# Patient Record
Sex: Female | Born: 1980 | Race: Black or African American | Hispanic: No | Marital: Single | Smoking: Never smoker
Health system: Southern US, Community
[De-identification: ages and names within clinical notes are randomized; demographics above are authoritative.]

## PROBLEM LIST (undated history)

## (undated) DIAGNOSIS — Z6836 Body mass index (BMI) 36.0-36.9, adult: Secondary | ICD-10-CM

## (undated) DIAGNOSIS — I10 Essential (primary) hypertension: Secondary | ICD-10-CM

## (undated) DIAGNOSIS — E785 Hyperlipidemia, unspecified: Secondary | ICD-10-CM

## (undated) DIAGNOSIS — E079 Disorder of thyroid, unspecified: Secondary | ICD-10-CM

## (undated) HISTORY — DX: Disorder of thyroid, unspecified: E07.9

## (undated) HISTORY — DX: Hyperlipidemia, unspecified: E78.5

## (undated) HISTORY — DX: Essential (primary) hypertension: I10

## (undated) HISTORY — DX: Body mass index (bmi) 36.0-36.9, adult: Z68.36

---

## 2009-12-24 HISTORY — PX: MYOMECTOMY: SHX85

## 2013-04-23 HISTORY — PX: UTERINE FIBROID SURGERY: SHX826

## 2013-05-14 ENCOUNTER — Ambulatory Visit: Payer: Self-pay | Admitting: Certified Nurse Midwife

## 2013-06-01 ENCOUNTER — Encounter: Payer: Self-pay | Admitting: Certified Nurse Midwife

## 2013-06-02 ENCOUNTER — Ambulatory Visit: Payer: Self-pay | Admitting: Certified Nurse Midwife

## 2013-06-02 DIAGNOSIS — Z01419 Encounter for gynecological examination (general) (routine) without abnormal findings: Secondary | ICD-10-CM

## 2013-06-03 ENCOUNTER — Encounter: Payer: Self-pay | Admitting: Certified Nurse Midwife

## 2013-06-05 ENCOUNTER — Telehealth: Payer: Self-pay | Admitting: Certified Nurse Midwife

## 2013-06-05 NOTE — Telephone Encounter (Signed)
Hi, Shanda Bumps! Please waive the dnka fee for this patient. Thanks!

## 2013-06-22 ENCOUNTER — Encounter: Payer: Self-pay | Admitting: Obstetrics and Gynecology

## 2013-06-22 ENCOUNTER — Ambulatory Visit (INDEPENDENT_AMBULATORY_CARE_PROVIDER_SITE_OTHER): Payer: Commercial Managed Care - PPO | Admitting: Obstetrics and Gynecology

## 2013-06-22 VITALS — BP 120/80 | HR 72 | Resp 12 | Ht 68.75 in | Wt 230.0 lb

## 2013-06-22 DIAGNOSIS — Z01419 Encounter for gynecological examination (general) (routine) without abnormal findings: Secondary | ICD-10-CM

## 2013-06-22 DIAGNOSIS — Z0000 Encounter for general adult medical examination without abnormal findings: Secondary | ICD-10-CM

## 2013-06-22 LAB — HEMOGLOBIN, FINGERSTICK: Hemoglobin, fingerstick: 11.8 g/dL — ABNORMAL LOW (ref 12.0–16.0)

## 2013-06-22 LAB — POCT URINALYSIS DIPSTICK
Leukocytes, UA: NEGATIVE
Urobilinogen, UA: NEGATIVE

## 2013-06-22 MED ORDER — NORETHINDRONE 0.35 MG PO TABS: 1.0000 | ORAL_TABLET | Freq: Every day | ORAL | Status: DC

## 2013-06-22 NOTE — Progress Notes (Signed)
32 y.o.   Single    African American   female   [id number]   here for annual exam.   Diagnosis of hypertension made last month.  Status post umbilical surgery exploration for bleeding from the umbilicus.  No endometriosis found. - Dr. Trecia Rogers in [place], [place]. Told she has a few fibroids yet.  She belidves the largest is 4 cm.  Status abdominal myomectomy in June 2011 in [place], [place].    Menses monthly.  Last 4 - 5 days.  Takes  Ibuprofen for pain during first two days.  Has an Rx for hydrocodone but does not like to take it.   Had a little bit of bleeding from the incision but has not had her post op visit yet.    Not sexually active since last visit.  Declines STD testing.    Took OCPs in the past.     Patient's last menstrual period was 06/16/2013.          Sexually active: no  The current method of family planning is none.    Exercising: zumba, yoga, walking 3-4x/wk. Last mammogram:  none Last pap smear: 05/12/12 History of abnormal pap: no Smoking: no Alcohol: no Last colonoscopy: none Last Bone Density:  none Last tetanus shot: 05/12/12 Last cholesterol check: 5/14- slightly elevated  Hgb: 11.8  Patient is post op.            Urine: Trace Protein; Trace Blood.  Menses just ended.   Family History  Problem Relation Age of Onset  . Diabetes Mother   . Anxiety disorder Mother   . Hypertension Mother   . Anxiety disorder Father   . Hypertension Father   . Diabetes Maternal Grandmother   . Breast cancer Maternal Grandmother   . Cancer Paternal Grandfather     lung    There are no active problems to display for this patient.   No past medical history on file.  Past Surgical History  Procedure Laterality Date  . Myomectomy  2011    Allergies: Review of patient's allergies indicates no known allergies.  Current Outpatient Prescriptions  Medication Sig Dispense Refill  . ASPIRIN PO Take by mouth as needed.      Marland Kitchen BIOTIN PO Take by mouth. occ      . IBUPROFEN  PO Take by mouth as needed.      . Multiple Vitamins-Minerals (MULTIVITAMIN PO) Take by mouth daily.       No current facility-administered medications for this visit.    ROS: Pertinent items are noted in HPI.  Social Hx:    Exam:    BP 120/80  Pulse 72  Resp 12  Ht 5' 8.75" (1.746 m)  Wt 230 lb (104.327 kg)  BMI 34.22 kg/m2  LMP 06/16/2013   Wt Readings from Last 3 Encounters:  06/22/13 230 lb (104.327 kg)     Ht Readings from Last 3 Encounters:  06/22/13 5' 8.75" (1.746 m)    General appearance: alert, cooperative and appears stated age Head: Normocephalic, without obvious abnormality, atraumatic Neck: no adenopathy, supple, symmetrical, trachea midline and thyroid not enlarged, symmetric, no tenderness/mass/nodules Lungs: clear to auscultation bilaterally Breasts: Inspection negative, No nipple retraction or dimpling, No nipple discharge or bleeding, No axillary or supraclavicular adenopathy, Normal to palpation without dominant masses Heart: regular rate and rhythm Abdomen: Vertical midline incision, umbilical incision with exudate noted. soft, non-tender; bowel sounds normal; no masses,  no organomegaly Extremities: extremities normal, atraumatic, no cyanosis or edema Skin:  Skin color, texture, turgor normal. No rashes or lesions Lymph nodes: Cervical, supraclavicular, and axillary nodes normal. No abnormal inguinal nodes palpated Neurologic: Grossly normal   Pelvic: External genitalia:  no lesions              Urethra:  normal appearing urethra with no masses, tenderness or lesions              Bartholins and Skenes: normal                 Vagina: normal appearing vagina with normal color and discharge, no lesions              Cervix: normal appearance              Pap taken: no        Bimanual Exam:  Uterus:  uterus is normal size, shape, consistency and nontender (Patient with difficulty relaxing to fell uterus well.)                                      Adnexa:  normal adnexa in size, nontender and no masses                                      Rectovaginal: Confirms                                      Anus:  normal sphincter tone, no lesions  A: normal exam Known uterine fibroids Recent umbilical exploration Dysmenorrhea. Hypertension Obesity     P: mammogram age 50 Do self breast exams Follow up with GYN in [place] for post op visit. Ortho Micronor Rx.  See Epic orders.  Discussed risks and benefits. pap smear at 32 year old. Discussed weight loss through diet and exercise.  Encouraged Weight Watcher's and small amounts of steady weight loss. return annually or prn     An After Visit Summary was printed and given to the patient.

## 2013-06-22 NOTE — Patient Instructions (Signed)
EXERCISE AND DIET:  We recommended that you start or continue a regular exercise program for good health. Regular exercise means any activity that makes your heart beat faster and makes you sweat.  We recommend exercising at least 30 minutes per day at least 3 days a week, preferably 4 or 5.  We also recommend a diet low in fat and sugar.  Inactivity, poor dietary choices and obesity can cause diabetes, heart attack, stroke, and kidney damage, among others.    ALCOHOL AND SMOKING:  Women should limit their alcohol intake to no more than 7 drinks/beers/glasses of wine (combined, not each!) per week. Moderation of alcohol intake to this level decreases your risk of breast cancer and liver damage. And of course, no recreational drugs are part of a healthy lifestyle.  And absolutely no smoking or even second hand smoke. Most people know smoking can cause heart and lung diseases, but did you know it also contributes to weakening of your bones? Aging of your skin?  Yellowing of your teeth and nails?  CALCIUM AND VITAMIN D:  Adequate intake of calcium and Vitamin D are recommended.  The recommendations for exact amounts of these supplements seem to change often, but generally speaking 600 mg of calcium (either carbonate or citrate) and 800 units of Vitamin D per day seems prudent. Certain women may benefit from higher intake of Vitamin D.  If you are among these women, your doctor will have told you during your visit.    PAP SMEARS:  Pap smears, to check for cervical cancer or precancers,  have traditionally been done yearly, although recent scientific advances have shown that most women can have pap smears less often.  However, every woman still should have a physical exam from her gynecologist every year. It will include a breast check, inspection of the vulva and vagina to check for abnormal growths or skin changes, a visual exam of the cervix, and then an exam to evaluate the size and shape of the uterus and  ovaries.  And after 30 years of age, a rectal exam is indicated to check for rectal cancers. We will also provide age appropriate advice regarding health maintenance, like when you should have certain vaccines, screening for sexually transmitted diseases, bone density testing, colonoscopy, mammograms, etc.   MAMMOGRAMS:  All women over 40 years old should have a yearly mammogram. Many facilities now offer a "3D" mammogram, which may cost around $50 extra out of pocket. If possible,  we recommend you accept the option to have the 3D mammogram performed.  It both reduces the number of women who will be called back for extra views which then turn out to be normal, and it is better than the routine mammogram at detecting truly abnormal areas.    COLONOSCOPY:  Colonoscopy to screen for colon cancer is recommended for all women at age 50.  We know, you hate the idea of the prep.  We agree, BUT, having colon cancer and not knowing it is worse!!  Colon cancer so often starts as a polyp that can be seen and removed at colonscopy, which can quite literally save your life!  And if your first colonoscopy is normal and you have no family history of colon cancer, most women don't have to have it again for 10 years.  Once every ten years, you can do something that may end up saving your life, right?  We will be happy to help you get it scheduled when you are ready.    Be sure to check your insurance coverage so you understand how much it will cost.  It may be covered as a preventative service at no cost, but you should check your particular policy.    Norethindrone tablets (contraception) What is this medicine? NORETHINDRONE is an oral contraceptive. The product contains a female hormone known as a progestin. It is used to prevent pregnancy. This medicine may be used for other purposes; ask your health care provider or pharmacist if you have questions. What should I tell my health care provider before I take this  medicine? They need to know if you have any of these conditions: -blood vessel disease or blood clots -breast, cervical, or vaginal cancer -diabetes -heart disease -kidney disease -liver disease -mental depression -migraine -seizures -stroke -vaginal bleeding -an unusual or allergic reaction to norethindrone, other medicines, foods, dyes, or preservatives -pregnant or trying to get pregnant -breast-feeding How should I use this medicine? Take this medicine by mouth with a glass of water. You may take it with or without food. Follow the directions on the prescription label. Take this medicine at the same time each day and in the order directed on the package. Do not take your medicine more often than directed. Contact your pediatrician regarding the use of this medicine in children. Special care may be needed. This medicine has been used in female children who have started having menstrual periods. A patient package insert for the product will be given with each prescription and refill. Read this sheet carefully each time. The sheet may change frequently. Overdosage: If you think you have taken too much of this medicine contact a poison control center or emergency room at once. NOTE: This medicine is only for you. Do not share this medicine with others. What if I miss a dose? Try not to miss a dose. Every time you miss a dose or take a dose late your chance of pregnancy increases. When 1 pill is missed (even if only 3 hours late), take the missed pill as soon as possible and continue taking a pill each day at the regular time (use a back up method of birth control for the next 48 hours). If more than 1 dose is missed, use an additional birth control method for the rest of your pill pack until menses occurs. Contact your health care professional if more than 1 dose has been missed. What may interact with this medicine? Do not take this medicine with any of the following  medications: -amprenavir or fosamprenavir -bosentan This medicine may also interact with the following medications: -antibiotics or medicines for infections, especially rifampin, rifabutin, rifapentine, and griseofulvin, and possibly penicillins or tetracyclines -aprepitant -barbiturate medicines, such as phenobarbital -carbamazepine -felbamate -modafinil -oxcarbazepine -phenytoin -ritonavir or other medicines for HIV infection or AIDS -St. John's wort -topiramate This list may not describe all possible interactions. Give your health care provider a list of all the medicines, herbs, non-prescription drugs, or dietary supplements you use. Also tell them if you smoke, drink alcohol, or use illegal drugs. Some items may interact with your medicine. What should I watch for while using this medicine? Visit your doctor or health care professional for regular checks on your progress. You will need a regular breast and pelvic exam and Pap smear while on this medicine. Use an additional method of birth control during the first cycle that you take these tablets. If you have any reason to think you are pregnant, stop taking this medicine right away and contact your   doctor or health care professional. If you are taking this medicine for hormone related problems, it may take several cycles of use to see improvement in your condition. This medicine does not protect you against HIV infection (AIDS) or any other sexually transmitted diseases. What side effects may I notice from receiving this medicine? Side effects that you should report to your doctor or health care professional as soon as possible: -breast tenderness or discharge -pain in the abdomen, chest, groin or leg -severe headache -skin rash, itching, or hives -sudden shortness of breath -unusually weak or tired -vision or speech problems -yellowing of skin or eyes Side effects that usually do not require medical attention (report to your  doctor or health care professional if they continue or are bothersome): -changes in sexual desire -change in menstrual flow -facial hair growth -fluid retention and swelling -headache -irritability -nausea -weight gain or loss This list may not describe all possible side effects. Call your doctor for medical advice about side effects. You may report side effects to FDA at 1-800-FDA-1088. Where should I keep my medicine? Keep out of the reach of children. Store at room temperature between 15 and 30 degrees C (59 and 86 degrees F). Throw away any unused medicine after the expiration date. NOTE: This sheet is a summary. It may not cover all possible information. If you have questions about this medicine, talk to your doctor, pharmacist, or health care provider.  2013, Elsevier/Gold Standard. (11/25/2008 1:54:05 PM)  

## 2013-09-23 ENCOUNTER — Ambulatory Visit: Payer: Commercial Managed Care - PPO | Admitting: Obstetrics and Gynecology

## 2013-09-23 ENCOUNTER — Telehealth: Payer: Self-pay | Admitting: Obstetrics and Gynecology

## 2013-09-23 NOTE — Telephone Encounter (Signed)
Patient Kaitlyn Cox appt today for 3 month recheck i called and got it reschedule for Monday 10/13 at 1:15.

## 2013-10-05 ENCOUNTER — Ambulatory Visit (INDEPENDENT_AMBULATORY_CARE_PROVIDER_SITE_OTHER): Payer: Commercial Managed Care - PPO | Admitting: Obstetrics and Gynecology

## 2013-10-05 ENCOUNTER — Encounter: Payer: Self-pay | Admitting: Obstetrics and Gynecology

## 2013-10-05 VITALS — BP 146/94 | HR 62 | Ht 68.75 in | Wt 230.0 lb

## 2013-10-05 DIAGNOSIS — E039 Hypothyroidism, unspecified: Secondary | ICD-10-CM

## 2013-10-05 DIAGNOSIS — E8881 Metabolic syndrome: Secondary | ICD-10-CM

## 2013-10-05 DIAGNOSIS — I10 Essential (primary) hypertension: Secondary | ICD-10-CM

## 2013-10-05 NOTE — Patient Instructions (Signed)
Levonorgestrel intrauterine device (IUD) What is this medicine? LEVONORGESTREL IUD (LEE voe nor jes trel) is a contraceptive (birth control) device. The device is placed inside the uterus by a healthcare professional. It is used to prevent pregnancy and can also be used to treat heavy bleeding that occurs during your period. Depending on the device, it can be used for 3 to 5 years. This medicine may be used for other purposes; ask your health care provider or pharmacist if you have questions. What should I tell my health care provider before I take this medicine? They need to know if you have any of these conditions: -abnormal Pap smear -cancer of the breast, uterus, or cervix -diabetes -endometritis -genital or pelvic infection now or in the past -have more than one sexual partner or your partner has more than one partner -heart disease -history of an ectopic or tubal pregnancy -immune system problems -IUD in place -liver disease or tumor -problems with blood clots or take blood-thinners -use intravenous drugs -uterus of unusual shape -vaginal bleeding that has not been explained -an unusual or allergic reaction to levonorgestrel, other hormones, silicone, or polyethylene, medicines, foods, dyes, or preservatives -pregnant or trying to get pregnant -breast-feeding How should I use this medicine? This device is placed inside the uterus by a health care professional. Talk to your pediatrician regarding the use of this medicine in children. Special care may be needed. Overdosage: If you think you have taken too much of this medicine contact a poison control center or emergency room at once. NOTE: This medicine is only for you. Do not share this medicine with others. What if I miss a dose? This does not apply. What may interact with this medicine? Do not take this medicine with any of the following medications: -amprenavir -bosentan -fosamprenavir This medicine may also interact with  the following medications: -aprepitant -barbiturate medicines for inducing sleep or treating seizures -bexarotene -griseofulvin -medicines to treat seizures like carbamazepine, ethotoin, felbamate, oxcarbazepine, phenytoin, topiramate -modafinil -pioglitazone -rifabutin -rifampin -rifapentine -some medicines to treat HIV infection like atazanavir, indinavir, lopinavir, nelfinavir, tipranavir, ritonavir -St. John's wort -warfarin This list may not describe all possible interactions. Give your health care provider a list of all the medicines, herbs, non-prescription drugs, or dietary supplements you use. Also tell them if you smoke, drink alcohol, or use illegal drugs. Some items may interact with your medicine. What should I watch for while using this medicine? Visit your doctor or health care professional for regular check ups. See your doctor if you or your partner has sexual contact with others, becomes HIV positive, or gets a sexual transmitted disease. This product does not protect you against HIV infection (AIDS) or other sexually transmitted diseases. You can check the placement of the IUD yourself by reaching up to the top of your vagina with clean fingers to feel the threads. Do not pull on the threads. It is a good habit to check placement after each menstrual period. Call your doctor right away if you feel more of the IUD than just the threads or if you cannot feel the threads at all. The IUD may come out by itself. You may become pregnant if the device comes out. If you notice that the IUD has come out use a backup birth control method like condoms and call your health care provider. Using tampons will not change the position of the IUD and are okay to use during your period. What side effects may I notice from receiving this medicine?   Side effects that you should report to your doctor or health care professional as soon as possible: -allergic reactions like skin rash, itching or  hives, swelling of the face, lips, or tongue -fever, flu-like symptoms -genital sores -high blood pressure -no menstrual period for 6 weeks during use -pain, swelling, warmth in the leg -pelvic pain or tenderness -severe or sudden headache -signs of pregnancy -stomach cramping -sudden shortness of breath -trouble with balance, talking, or walking -unusual vaginal bleeding, discharge -yellowing of the eyes or skin Side effects that usually do not require medical attention (report to your doctor or health care professional if they continue or are bothersome): -acne -breast pain -change in sex drive or performance -changes in weight -cramping, dizziness, or faintness while the device is being inserted -headache -irregular menstrual bleeding within first 3 to 6 months of use -nausea This list may not describe all possible side effects. Call your doctor for medical advice about side effects. You may report side effects to FDA at 1-800-FDA-1088. Where should I keep my medicine? This does not apply. NOTE: This sheet is a summary. It may not cover all possible information. If you have questions about this medicine, talk to your doctor, pharmacist, or health care provider.  2013, Elsevier/Gold Standard. (01/10/2012 1:54:04 PM)  

## 2013-10-05 NOTE — Progress Notes (Signed)
Patient ID: Kaitlyn Cox, female   DOB: May 12, 1981, 32 y.o.   MRN: [id number]  Subjective  Patient is here for a 3 month pill recheck. LMP 10/01/13 - normal.   Was switched to Ortho Micronor due to hypertension but never started the birth control.  This was treatment for dysmenorrhea.  Not sexually active.  Has a gynecologist in [place].  Had surgery there in May. Patient did a well lab check there. Was diagnosed with hypothyroidism and insulin resistance.  On Armour thyroid for less than a month. Doing dietary changes and no medication. Strong family history of diabetes.  Elevated cholesterol as well.  Not taking medication for hypertension as she does not want to become dependent on it.  Has not yet returned for a recheck there.  Patient states that she lives and works in [place].  Had a connection to [place] because she was going to school there.   Objective  BP 146/94.  P 62  No exam  Assessment  Hypertension, untreated. Hyperlipidemia. Insulin resistance. Hypothyroidism.  Family history of diabetes. Dysmenorrhea.  Plan  I recommended that the patient establish care with an internist here is [facility] to address her medical issues.  I will refer her to Dr. Rene Paci.  Order placed. Patient will start on Camilla.   I also discussed Mirena IUD and gave the patient written information.  She will consider this.  Follow up for annual exam and prn.

## 2013-10-15 ENCOUNTER — Telehealth: Payer: Self-pay | Admitting: Family

## 2013-10-15 NOTE — Telephone Encounter (Signed)
I was trying to schedule NP appointment with pt but lost call.  Called back but no answer. Left a message for her to call back to confirm if appointment scheduled is good and to verify remaining registration information.  Thanks   [employer]

## 2013-10-15 NOTE — Telephone Encounter (Signed)
Referral in progress.   Kaitlyn Cox is working on provider who can see patient before end of year. Kaitlyn Cox spoke with patient and will call back with information.

## 2013-10-15 NOTE — Telephone Encounter (Signed)
pt is waiting to get a referral to an internist

## 2013-10-15 NOTE — Telephone Encounter (Signed)
Returned patient's phone call. Message left to return call to [facility] at [phone].   Will advise patient, if you would like to start care with a PCP can choose a provider that works with your insurance company, can call to schedule appointment. May not need referral if just needs a pcp.  Will discuss with patient at return phone call.

## 2013-10-15 NOTE — Telephone Encounter (Signed)
Called [facility] to follow up with referral that was dropped into their workqueue on 10/14. Patient has not been contacted or scheduled.   I called the patient back and gave her an update. I also provided her with the phone number for [facility]. Patient is appreciative.

## 2013-10-27 ENCOUNTER — Ambulatory Visit: Payer: Self-pay | Admitting: Family

## 2013-10-29 ENCOUNTER — Ambulatory Visit (INDEPENDENT_AMBULATORY_CARE_PROVIDER_SITE_OTHER): Payer: Commercial Managed Care - PPO | Admitting: Family

## 2013-10-29 ENCOUNTER — Encounter: Payer: Self-pay | Admitting: Family

## 2013-10-29 VITALS — HR 75 | Ht 68.5 in | Wt 238.0 lb

## 2013-10-29 DIAGNOSIS — E669 Obesity, unspecified: Secondary | ICD-10-CM

## 2013-10-29 DIAGNOSIS — I10 Essential (primary) hypertension: Secondary | ICD-10-CM | POA: Insufficient documentation

## 2013-10-29 DIAGNOSIS — R7309 Other abnormal glucose: Secondary | ICD-10-CM

## 2013-10-29 DIAGNOSIS — E039 Hypothyroidism, unspecified: Secondary | ICD-10-CM

## 2013-10-29 DIAGNOSIS — R739 Hyperglycemia, unspecified: Secondary | ICD-10-CM

## 2013-10-29 LAB — HEMOGLOBIN A1C: Hgb A1c MFr Bld: 6 % (ref 4.6–6.5)

## 2013-10-29 LAB — CBC WITH DIFFERENTIAL/PLATELET
Basophils Absolute: 0 10*3/uL (ref 0.0–0.1)
Eosinophils Absolute: 0.2 10*3/uL (ref 0.0–0.7)
Lymphocytes Relative: 26.3 % (ref 12.0–46.0)
MCHC: 33.1 g/dL (ref 30.0–36.0)
MCV: 82.4 fl (ref 78.0–100.0)
Neutrophils Relative %: 61.1 % (ref 43.0–77.0)
Platelets: 280 10*3/uL (ref 150.0–400.0)
RDW: 15.6 % — ABNORMAL HIGH (ref 11.5–14.6)

## 2013-10-29 LAB — TSH: TSH: 1.14 u[IU]/mL (ref 0.35–5.50)

## 2013-10-29 LAB — BASIC METABOLIC PANEL
CO2: 23 mEq/L (ref 19–32)
Chloride: 105 mEq/L (ref 96–112)
Creatinine, Ser: 0.7 mg/dL (ref 0.4–1.2)
Glucose, Bld: 96 mg/dL (ref 70–99)
Sodium: 136 mEq/L (ref 135–145)

## 2013-10-29 LAB — HEPATIC FUNCTION PANEL
ALT: 13 U/L (ref 0–35)
AST: 18 U/L (ref 0–37)
Alkaline Phosphatase: 48 U/L (ref 39–117)
Bilirubin, Direct: 0 mg/dL (ref 0.0–0.3)
Total Bilirubin: 0.6 mg/dL (ref 0.3–1.2)
Total Protein: 7.6 g/dL (ref 6.0–8.3)

## 2013-10-29 NOTE — Progress Notes (Signed)
Subjective:    Patient ID: Kaitlyn Cox, female    DOB: 1981/04/05, 32 y.o.   MRN: [id number]  HPI 32 year old [place] American female, new patient to the practice and to be established. She is transferring her care from [facility]. Her PCP in [place] told her that she had insulin resistance and elevated blood pressure. Encouraged her to see a primary care provider locally. She was recently diagnosed with hypothyroidism and is currently taking Armour Thyroid 30 mg once a day. She exercises 3-4 times per week. period she is a family history significant for type 2 diabetes, hypertension, hyperlipidemia. Her mother, maternal grandmother, maternal grandfather all have type 2 diabetes   Review of Systems  Constitutional: Negative.   HENT: Negative.   Respiratory: Negative.   Cardiovascular: Negative.   Gastrointestinal: Negative.   Endocrine: Negative.   Genitourinary: Negative.   Musculoskeletal: Negative.   Skin: Negative.   Neurological: Negative.   Psychiatric/Behavioral: Negative.    Past Medical History  Diagnosis Date  . Thyroid disease     hypothyroid    History   Social History  . Marital Status: Single    Spouse Name: N/A    Number of Children: N/A  . Years of Education: N/A   Occupational History  . Not on file.   Social History Main Topics  . Smoking status: Never Smoker   . Smokeless tobacco: Not on file  . Alcohol Use: No  . Drug Use: No  . Sexual Activity: Not Currently    Partners: Male    Birth Control/ Protection: Abstinence   Other Topics Concern  . Not on file   Social History Narrative  . No narrative on file    Past Surgical History  Procedure Laterality Date  . Myomectomy  2011  . Uterine fibroid surgery  5/14    Family History  Problem Relation Age of Onset  . Diabetes Mother   . Anxiety disorder Mother   . Hypertension Mother   . Anxiety disorder Father   . Hypertension Father   . Diabetes Maternal Grandmother    . Breast cancer Maternal Grandmother   . Cancer Paternal Grandfather     lung    No Known Allergies  Current Outpatient Prescriptions on File Prior to Visit  Medication Sig Dispense Refill  . ARMOUR THYROID 30 MG tablet Take 1 tablet by mouth daily.      . IBUPROFEN PO Take by mouth as needed.      . Multiple Vitamins-Minerals (MULTIVITAMIN PO) Take by mouth daily.       No current facility-administered medications on file prior to visit.    Pulse 75  Ht 5' 8.5" (1.74 m)  Wt 238 lb (107.956 kg)  BMI 35.66 kg/m2  LMP 10/09/2014chart     Objective:   Physical Exam  Constitutional: She is oriented to person, place, and time. She appears well-developed and well-nourished.  HENT:  Right Ear: External ear normal.  Left Ear: External ear normal.  Nose: Nose normal.  Mouth/Throat: Oropharynx is clear and moist.  Neck: Neck supple.  Cardiovascular: Normal rate, regular rhythm and normal heart sounds.   Pulmonary/Chest: Effort normal and breath sounds normal.  Abdominal: Soft. Bowel sounds are normal.  Musculoskeletal: Normal range of motion.  Neurological: She is oriented to person, place, and time.  Skin: Skin is warm and dry.  Psychiatric: She has a normal mood and affect.          Assessment & Plan:  Assessment:  1. Hypothyroidism 2. Hypertension 3. Insulin Resistance  Plan: Labs sent, will notify patient pending results and discuss further treatment plan.

## 2013-10-29 NOTE — Patient Instructions (Signed)
Sodium-Controlled Diet Sodium is a mineral. It is found in many foods. Sodium may be found naturally or added during the making of a food. The most common form of sodium is salt, which is made up of sodium and chloride. Reducing your sodium intake involves changing your eating habits. The following guidelines will help you reduce the sodium in your diet:  Stop using the salt shaker.  Use salt sparingly in cooking and baking.  Substitute with sodium-free seasonings and spices.  Do not use a salt substitute (potassium chloride) without your caregiver's permission.  Include a variety of fresh, unprocessed foods in your diet.  Limit the use of processed and convenience foods that are high in sodium. USE THE FOLLOWING FOODS SPARINGLY: Breads/Starches  Commercial bread stuffing, commercial pancake or waffle mixes, coating mixes. Waffles. Croutons. Prepared (boxed or frozen) potato, rice, or noodle mixes that contain salt or sodium. Salted [place] fries or hash browns. Salted popcorn, breads, crackers, chips, or snack foods. Vegetables  Vegetables canned with salt or prepared in cream, butter, or cheese sauces. Sauerkraut. Tomato or vegetable juices canned with salt.  Fresh vegetables are allowed if rinsed thoroughly. Fruit  Fruit is okay to eat. Meat and Meat Substitutes  Salted or smoked meats, such as bacon or Canadian bacon, chipped or corned beef, hot dogs, salt pork, luncheon meats, pastrami, ham, or sausage. Canned or smoked fish, poultry, or meat. Processed cheese or cheese spreads, blue or Roquefort cheese. Battered or frozen fish products. Prepared spaghetti sauce. Baked beans. Reuben sandwiches. Salted nuts. Caviar. Milk  Limit buttermilk to 1 cup per week. Soups and Combination Foods  Bouillon cubes, canned or dried soups, broth, consomm. Convenience (frozen or packaged) dinners with more than 600 mg sodium. Pot pies, pizza, Asian food, fast food cheeseburgers, and specialty  sandwiches. Desserts and Sweets  Regular (salted) desserts, pie, commercial fruit snack pies, commercial snack cakes, canned puddings.  Eat desserts and sweets in moderation. Fats and Oils  Gravy mixes or canned gravy. No more than 1 to 2 tbs of salad dressing. Chip dips.  Eat fats and oils in moderation. Beverages  See those listed under the vegetables and milk groups. Condiments  Ketchup, mustard, meat sauces, salsa, regular (salted) and lite soy sauce or mustard. Dill pickles, olives, meat tenderizer. Prepared horseradish or pickle relish. Dutch-processed cocoa. Baking powder or baking soda used medicinally. Worcestershire sauce. "Light" salt. Salt substitute, unless approved by your caregiver. Document Released: 06/01/2002 Document Revised: 03/03/2012 Document Reviewed: 01/02/2010 [facility] Patient Information 2014 [place], [place].     Diabetes and Exercise Exercising regularly is important. It is not just about losing weight. It has many health benefits, such as:  Improving your overall fitness, flexibility, and endurance.  Increasing your bone density.  Helping with weight control.  Decreasing your body fat.  Increasing your muscle strength.  Reducing stress and tension.  Improving your overall health. People with diabetes who exercise gain additional benefits because exercise:  Reduces appetite.  Improves the body's use of blood sugar (glucose).  Helps lower or control blood glucose.  Decreases blood pressure.  Helps control blood lipids (such as cholesterol and triglycerides).  Improves the body's use of the hormone insulin by:  Increasing the body's insulin sensitivity.  Reducing the body's insulin needs.  Decreases the risk for heart disease because exercising:  Lowers cholesterol and triglycerides levels.  Increases the levels of good cholesterol (such as high-density lipoproteins [HDL]) in the body.  Lowers blood glucose levels. YOUR ACTIVITY  PLAN  Choose an activity that you enjoy and set realistic goals. Your health care provider or diabetes educator can help you make an activity plan that works for you. You can break activities into 2 or 3 sessions throughout the day. Doing so is as good as one long session. Exercise ideas include:  Taking the dog for a walk.  Taking the stairs instead of the elevator.  Dancing to your favorite song.  Doing your favorite exercise with a friend. RECOMMENDATIONS FOR EXERCISING WITH TYPE 1 OR TYPE 2 DIABETES   Check your blood glucose before exercising. If blood glucose levels are greater than 240 mg/dL, check for urine ketones. Do not exercise if ketones are present.  Avoid injecting insulin into areas of the body that are going to be exercised. For example, avoid injecting insulin into:  The arms when playing tennis.  The legs when jogging.  Keep a record of:  Food intake before and after you exercise.  Expected peak times of insulin action.  Blood glucose levels before and after you exercise.  The type and amount of exercise you have done.  Review your records with your health care provider. Your health care provider will help you to develop guidelines for adjusting food intake and insulin amounts before and after exercising.  If you take insulin or oral hypoglycemic agents, watch for signs and symptoms of hypoglycemia. They include:  Dizziness.  Shaking.  Sweating.  Chills.  Confusion.  Drink plenty of water while you exercise to prevent dehydration or heat stroke. Body water is lost during exercise and must be replaced.  Talk to your health care provider before starting an exercise program to make sure it is safe for you. Remember, almost any type of activity is better than none. Document Released: 03/01/2004 Document Revised: 08/12/2013 Document Reviewed: 05/19/2013 [facility] Patient Information 2014 [place], [place].

## 2013-11-06 ENCOUNTER — Telehealth: Payer: Self-pay | Admitting: Family

## 2013-11-06 NOTE — Telephone Encounter (Signed)
Pt aware.

## 2013-11-06 NOTE — Telephone Encounter (Signed)
Pt would like blood work result  °

## 2013-11-16 ENCOUNTER — Encounter: Payer: Self-pay | Admitting: Family

## 2013-11-16 ENCOUNTER — Other Ambulatory Visit: Payer: Self-pay | Admitting: Family

## 2013-11-16 MED ORDER — THYROID 30 MG PO TABS: 30.0000 mg | ORAL_TABLET | Freq: Every day | ORAL | Status: DC

## 2014-02-24 ENCOUNTER — Encounter: Payer: Self-pay | Admitting: Obstetrics and Gynecology

## 2014-05-02 ENCOUNTER — Other Ambulatory Visit: Payer: Self-pay | Admitting: Family

## 2014-05-11 ENCOUNTER — Encounter: Payer: Self-pay | Admitting: Physician Assistant

## 2014-05-11 ENCOUNTER — Ambulatory Visit (INDEPENDENT_AMBULATORY_CARE_PROVIDER_SITE_OTHER): Payer: Commercial Managed Care - PPO | Admitting: Physician Assistant

## 2014-05-11 VITALS — BP 160/114 | HR 82 | Temp 99.1°F | Resp 20 | Wt 234.0 lb

## 2014-05-11 DIAGNOSIS — I160 Hypertensive urgency: Secondary | ICD-10-CM

## 2014-05-11 DIAGNOSIS — I10 Essential (primary) hypertension: Secondary | ICD-10-CM

## 2014-05-11 LAB — CBC WITH DIFFERENTIAL/PLATELET
Basophils Absolute: 0 10*3/uL (ref 0.0–0.1)
Basophils Relative: 0.7 % (ref 0.0–3.0)
EOS ABS: 0.2 10*3/uL (ref 0.0–0.7)
EOS PCT: 2.8 % (ref 0.0–5.0)
HEMATOCRIT: 38.3 % (ref 36.0–46.0)
Hemoglobin: 12.8 g/dL (ref 12.0–15.0)
LYMPHS ABS: 2 10*3/uL (ref 0.7–4.0)
Lymphocytes Relative: 30.2 % (ref 12.0–46.0)
MCHC: 33.4 g/dL (ref 30.0–36.0)
MCV: 83.7 fl (ref 78.0–100.0)
MONO ABS: 0.5 10*3/uL (ref 0.1–1.0)
Monocytes Relative: 7.5 % (ref 3.0–12.0)
NEUTROS PCT: 58.8 % (ref 43.0–77.0)
Neutro Abs: 3.8 10*3/uL (ref 1.4–7.7)
PLATELETS: 294 10*3/uL (ref 150.0–400.0)
RBC: 4.57 Mil/uL (ref 3.87–5.11)
RDW: 14.8 % (ref 11.5–15.5)
WBC: 6.5 10*3/uL (ref 4.0–10.5)

## 2014-05-11 LAB — BASIC METABOLIC PANEL
BUN: 13 mg/dL (ref 6–23)
CO2: 25 mEq/L (ref 19–32)
Calcium: 9.4 mg/dL (ref 8.4–10.5)
Chloride: 105 mEq/L (ref 96–112)
Creatinine, Ser: 0.8 mg/dL (ref 0.4–1.2)
GFR: 86.76 mL/min (ref >60.00)
Glucose, Bld: 89 mg/dL (ref 70–99)
POTASSIUM: 3.9 meq/L (ref 3.5–5.1)
SODIUM: 137 meq/L (ref 135–145)

## 2014-05-11 LAB — T4, FREE: FREE T4: 0.8 ng/dL (ref 0.60–1.60)

## 2014-05-11 LAB — TSH: TSH: 0.39 u[IU]/mL (ref 0.35–4.50)

## 2014-05-11 MED ORDER — CLONIDINE HCL 0.1 MG PO TABS: 0.1000 mg | ORAL_TABLET | Freq: Once | ORAL | Status: DC

## 2014-05-11 MED ORDER — OLMESARTAN MEDOXOMIL-HCTZ 20-12.5 MG PO TABS: 2.0000 | ORAL_TABLET | Freq: Once | ORAL | Status: DC

## 2014-05-11 MED ORDER — LISINOPRIL-HYDROCHLOROTHIAZIDE 20-25 MG PO TABS: 1.0000 | ORAL_TABLET | Freq: Every day | ORAL | Status: DC

## 2014-05-11 NOTE — Patient Instructions (Signed)
We will call you with the lab results when they are available.  We were able to successfully get your blood pressure down to a non-emergent level.  We have sent in a prescription for lisinopril-hydrochlorothiazide 20mg -25mg  one pill once daily. You should start taking this medication tomorrow morning.  Begin checking your blood pressure at home multiple times per day with the blood pressure cuff I mentioned to you in the visit. (Omron BP Cuff on [employer])  Keep a log of your blood pressures to discuss at future visits.  Follow up to clinic this Friday to reassess BP and see how you are tolerating the medication. Followup sooner if symptoms worsen despite treatment.     Hypertension Hypertension is another name for high blood pressure. High blood pressure may mean that your heart needs to work harder to pump blood. Blood pressure consists of two numbers, which includes a higher number over a lower number (example: 110/72). HOME CARE   Make lifestyle changes as told by your doctor. This may include weight loss and exercise.  Take your blood pressure medicine every day.  Limit how much salt you use.  Stop smoking if you smoke.  Do not use drugs.  Talk to your doctor if you are using decongestants or birth control pills. These medicines might make blood pressure higher.  Females should not drink more than 1 alcoholic drink per day. Males should not drink more than 2 alcoholic drinks per day.  See your doctor as told. GET HELP RIGHT AWAY IF:   You have a blood pressure reading with a top number of 180 or higher.  You get a very bad headache.  You get blurred or changing vision.  You feel confused.  You feel weak, numb, or faint.  You get chest or belly (abdominal) pain.  You throw up (vomit).  You cannot breathe very well. MAKE SURE YOU:   Understand these instructions.  Will watch your condition.  Will get help right away if you are not doing well or get  worse. Document Released: 05/28/2008 Document Revised: 03/03/2012 Document Reviewed: 05/28/2008 [facility] Patient Information 2014 [place], [place].

## 2014-05-11 NOTE — Progress Notes (Signed)
Subjective:    Patient ID: Kaitlyn Cox, female    DOB: 14-Jan-1981, 33 y.o.   MRN: [id number]  Hypertension   Patient is a 33 year old African American female presenting to the clinic for high blood pressure. Patient states that she was having a routine health assessment by her insurance company at her workplace, where she was discovered to have a high blood pressure on several readings in both arms. These readings were in the 180s over 120s range. She was directed to be seen by her PCP today. She recently moved to the area from [place], and established at this practice with a PCP in November of last year. Her blood pressures have usually been running in the 130s over 80s. At that time she was instructed to modify her activity level and change her diet to a low salt diet in order to prevent events that of hypertension. Patient states that her new job has become increasingly stressful and she believes that this could be causing her elevated blood pressure. The patient states that, since her physical in November, she has been checking her blood pressure readings at the pharmacy, and has noticed her blood pressure readings increasing over the past 4 or 5 months. She also notes occasional headaches that have become more frequent. There is no apparent pattern to the headaches, other than possibly being brought on by stress. The headaches quickly resolve and they do not require any over-the-counter medications to resolve. She denies nausea, vomiting, dizziness, lightheadedness, syncope, chest pain, shortness of breath, visual disturbances, or any other symptoms. She denies fevers, chills, diarrhea, and all other constitutional symptoms.   Review of Systems As per the history of present illness and are otherwise negative.  Past Medical History  Diagnosis Date  . Thyroid disease     hypothyroid   Past Surgical History  Procedure Laterality Date  . Myomectomy  2011  . Uterine fibroid surgery  5/14      reports that she has never smoked. She does not have any smokeless tobacco history on file. She reports that she does not drink alcohol or use illicit drugs. family history includes Anxiety disorder in her father and mother; Breast cancer in her maternal grandmother; Cancer in her paternal grandfather; Diabetes in her maternal grandmother and mother; Hypertension in her father and mother. No Known Allergies   All PFS history was reviewed with the PT at time of visit.      Objective:   Physical Exam  Nursing note and vitals reviewed. Constitutional: She is oriented to person, place, and time. She appears well-developed and well-nourished. No distress.  HENT:  Head: Normocephalic and atraumatic.  Eyes: Conjunctivae and EOM are normal. Pupils are equal, round, and reactive to light.  Neck: Normal range of motion. Neck supple.  Cardiovascular: Normal rate, regular rhythm, normal heart sounds and intact distal pulses.  Exam reveals no gallop and no friction rub.   No murmur heard. Pulmonary/Chest: Effort normal and breath sounds normal. No respiratory distress. She has no wheezes. She has no rales. She exhibits no tenderness.  Musculoskeletal: Normal range of motion.  Neurological: She is alert and oriented to person, place, and time.  Skin: Skin is warm and dry. No rash noted. She is not diaphoretic. No erythema. No pallor.  Psychiatric: She has a normal mood and affect. Her behavior is normal. Judgment and thought content normal.    Filed Vitals:   05/11/14 1251 05/11/14 1403 05/11/14 1420 05/11/14 1440  BP: 196/134 186/126 176/118  160/114  Pulse: 82     Temp: 99.1 F (37.3 C)     TempSrc: Oral     Resp: 20     Weight: 234 lb (106.142 kg)     SpO2: 98%      Lab Results  Component Value Date   WBC 4.9 10/29/2013   HGB 12.7 10/29/2013   HCT 38.2 10/29/2013   PLT 280.0 10/29/2013   GLUCOSE 96 10/29/2013   ALT 13 10/29/2013   AST 18 10/29/2013   NA 136 10/29/2013   K 4.2 10/29/2013    CL 105 10/29/2013   CREATININE 0.7 10/29/2013   BUN 10 10/29/2013   CO2 23 10/29/2013   TSH 1.14 10/29/2013   HGBA1C 6.0 10/29/2013   EKG: normal EKG, normal sinus rhythm, there are no previous tracings available for comparison.     Assessment & Plan:  Kaitlyn Cox was seen today for hypertension.  Diagnoses and associated orders for this visit:  Hypertensive urgency - cloNIDine (CATAPRES) tablet 0.1 mg; Take 1 tablet (0.1 mg total) by mouth once.  -This was given at 1330. BP was checked at 15 min and 30 min from 1st dose. - EKG 12-Lead - CBC with Differential - Basic Metabolic Panel - TSH - T4, Free - cloNIDine (CATAPRES) tablet 0.1 mg; Take 1 tablet (0.1 mg total) by mouth once.  - This was the second dose, it was given at 1415. BP was checked at 30 min mark. - olmesartan-hydrochlorothiazide (BENICAR HCT) 20-12.5 MG per tablet; Take 2 tablets by mouth once.  - This was given after last BP reading as a loading dose for the similar long term medication prescribed which Pt will start tomorrow. - lisinopril-hydrochlorothiazide (PRINZIDE,ZESTORETIC) 20-25 MG per tablet; Take 1 tablet by mouth daily.  Pt will follow up in 3 days for reassessment.   Patient Instructions  We will call you with the lab results when they are available.  We were able to successfully get your blood pressure down to a non-emergent level.  We have sent in a prescription for lisinopril-hydrochlorothiazide 20mg -25mg  one pill once daily. You should start taking this medication tomorrow morning.  Begin checking your blood pressure at home multiple times per day with the blood pressure cuff I mentioned to you in the visit. (Omron BP Cuff on [employer])  Keep a log of your blood pressures to discuss at future visits.  Follow up to clinic this Friday to reassess BP and see how you are tolerating the medication. Followup sooner if symptoms worsen despite treatment.

## 2014-05-11 NOTE — Progress Notes (Signed)
Pre visit review using our clinic review tool, if applicable. No additional management support is needed unless otherwise documented below in the visit note. 

## 2014-05-14 ENCOUNTER — Encounter: Payer: Self-pay | Admitting: Physician Assistant

## 2014-05-14 ENCOUNTER — Ambulatory Visit (INDEPENDENT_AMBULATORY_CARE_PROVIDER_SITE_OTHER): Payer: Commercial Managed Care - PPO | Admitting: Physician Assistant

## 2014-05-14 VITALS — BP 130/90 | HR 67 | Temp 99.2°F | Resp 18 | Wt 230.0 lb

## 2014-05-14 DIAGNOSIS — Z09 Encounter for follow-up examination after completed treatment for conditions other than malignant neoplasm: Secondary | ICD-10-CM

## 2014-05-14 DIAGNOSIS — I10 Essential (primary) hypertension: Secondary | ICD-10-CM

## 2014-05-14 NOTE — Progress Notes (Signed)
Pre visit review using our clinic review tool, if applicable. No additional management support is needed unless otherwise documented below in the visit note. 

## 2014-05-14 NOTE — Progress Notes (Signed)
Subjective:    Patient ID: Kaitlyn Cox, female    DOB: 06-05-1981, 33 y.o.   MRN: [id number]  HPI The patient is a 33 year old African American female presenting to the clinic for followup of hypertension. Patient was seen 3 days ago and hypertensive urgency, which was treated in clinic with clonidine to a blood pressure of 160/114. The patient was prescribed lisinopril hydrochlorothiazide combo pill and told to followup in 3 days. The patient states that she is tolerating the medication well, and not experiencing any adverse effects. The patient is in the process of purchasing her own blood pressure cuff for home readings, and in the meantime has been checking her blood pressure at a local drugstore. She states that her last reading was in the 140s/90s. Her reading today in office is 130/90. She denies fevers, chills, nausea, vomiting, diarrhea, shortness of breath, chest pain, headache, dizziness, and syncope.   Review of Systems As per the history of present illness and are otherwise negative.   Past Medical History  Diagnosis Date  . Thyroid disease     hypothyroid   Past Surgical History  Procedure Laterality Date  . Myomectomy  2011  . Uterine fibroid surgery  5/14    reports that she has never smoked. She does not have any smokeless tobacco history on file. She reports that she does not drink alcohol or use illicit drugs. family history includes Anxiety disorder in her father and mother; Breast cancer in her maternal grandmother; Cancer in her paternal grandfather; Diabetes in her maternal grandmother and mother; Hypertension in her father and mother. No Known Allergies     Objective:   Physical Exam  Nursing note and vitals reviewed. Constitutional: She is oriented to person, place, and time. She appears well-developed and well-nourished. No distress.  HENT:  Head: Normocephalic and atraumatic.  Eyes: Conjunctivae and EOM are normal. Pupils are equal, round, and reactive  to light.  Neck: Normal range of motion. Neck supple.  Cardiovascular: Normal rate, regular rhythm, normal heart sounds and intact distal pulses.  Exam reveals no gallop and no friction rub.   No murmur heard. Pulmonary/Chest: Effort normal and breath sounds normal. No respiratory distress. She has no wheezes. She has no rales. She exhibits no tenderness.  Musculoskeletal: Normal range of motion.  Neurological: She is alert and oriented to person, place, and time.  Skin: Skin is warm and dry. No rash noted. She is not diaphoretic. No erythema. No pallor.  Psychiatric: She has a normal mood and affect. Her behavior is normal. Judgment and thought content normal.    Filed Vitals:   05/14/14 0854  BP: 130/90  Pulse: 67  Temp: 99.2 F (37.3 C)  Resp: 18    Lab Results  Component Value Date   WBC 6.5 05/11/2014   HGB 12.8 05/11/2014   HCT 38.3 05/11/2014   PLT 294.0 05/11/2014   GLUCOSE 89 05/11/2014   ALT 13 10/29/2013   AST 18 10/29/2013   NA 137 05/11/2014   K 3.9 05/11/2014   CL 105 05/11/2014   CREATININE 0.8 05/11/2014   BUN 13 05/11/2014   CO2 25 05/11/2014   TSH 0.39 05/11/2014   HGBA1C 6.0 10/29/2013      Assessment & Plan:  Sadiya was seen today for follow up blood pressure.  Diagnoses and associated orders for this visit:  Follow up  Unspecified essential hypertension   BP is under better control, and seems to be stabilizing on medication.  Pt will  continue at home BP monitoring.  Pt will follow up in 1 month with PCP.   Patient Instructions  Continue your blood pressure medicine, it has already started controlling your BP well.  Continue at home BP recording.  Follow up in about 1 month with your PCP to continue long term management of your BP.  Follow up sooner for any other concerns.

## 2014-05-14 NOTE — Patient Instructions (Signed)
Continue your blood pressure medicine, it has already started controlling your BP well.  Continue at home BP recording.  Follow up in about 1 month with your PCP to continue long term management of your BP.  Follow up sooner for any other concerns.   Hypertension Hypertension is another name for high blood pressure. High blood pressure may mean that your heart needs to work harder to pump blood. Blood pressure consists of two numbers, which includes a higher number over a lower number (example: 110/72). HOME CARE   Make lifestyle changes as told by your doctor. This may include weight loss and exercise.  Take your blood pressure medicine every day.  Limit how much salt you use.  Stop smoking if you smoke.  Do not use drugs.  Talk to your doctor if you are using decongestants or birth control pills. These medicines might make blood pressure higher.  Females should not drink more than 1 alcoholic drink per day. Males should not drink more than 2 alcoholic drinks per day.  See your doctor as told. GET HELP RIGHT AWAY IF:   You have a blood pressure reading with a top number of 180 or higher.  You get a very bad headache.  You get blurred or changing vision.  You feel confused.  You feel weak, numb, or faint.  You get chest or belly (abdominal) pain.  You throw up (vomit).  You cannot breathe very well. MAKE SURE YOU:   Understand these instructions.  Will watch your condition.  Will get help right away if you are not doing well or get worse. Document Released: 05/28/2008 Document Revised: 03/03/2012 Document Reviewed: 05/28/2008 [facility] Patient Information 2014 [place], [place].

## 2014-05-20 ENCOUNTER — Other Ambulatory Visit: Payer: Commercial Managed Care - PPO

## 2014-05-20 ENCOUNTER — Telehealth: Payer: Self-pay | Admitting: Family

## 2014-05-20 ENCOUNTER — Encounter: Payer: Self-pay | Admitting: Internal Medicine

## 2014-05-20 ENCOUNTER — Ambulatory Visit (INDEPENDENT_AMBULATORY_CARE_PROVIDER_SITE_OTHER): Payer: Commercial Managed Care - PPO | Admitting: Internal Medicine

## 2014-05-20 VITALS — BP 124/92 | Temp 99.0°F | Ht 68.5 in | Wt 227.0 lb

## 2014-05-20 DIAGNOSIS — I10 Essential (primary) hypertension: Secondary | ICD-10-CM

## 2014-05-20 DIAGNOSIS — R109 Unspecified abdominal pain: Secondary | ICD-10-CM

## 2014-05-20 LAB — POCT URINALYSIS DIPSTICK
Bilirubin, UA: NEGATIVE
Glucose, UA: NEGATIVE
Ketones, UA: NEGATIVE
Leukocytes, UA: NEGATIVE
NITRITE UA: NEGATIVE
PH UA: 5
Spec Grav, UA: 1.025
UROBILINOGEN UA: 0.2

## 2014-05-20 LAB — BASIC METABOLIC PANEL
BUN: 14 mg/dL (ref 6–23)
CALCIUM: 9.3 mg/dL (ref 8.4–10.5)
CHLORIDE: 101 meq/L (ref 96–112)
CO2: 28 meq/L (ref 19–32)
Creatinine, Ser: 0.9 mg/dL (ref 0.4–1.2)
GFR: 91.91 mL/min (ref >60.00)
GLUCOSE: 83 mg/dL (ref 70–99)
Potassium: 3.7 mEq/L (ref 3.5–5.1)
SODIUM: 137 meq/L (ref 135–145)

## 2014-05-20 LAB — CBC WITH DIFFERENTIAL/PLATELET
BASOS PCT: 0.5 % (ref 0.0–3.0)
Basophils Absolute: 0 10*3/uL (ref 0.0–0.1)
EOS PCT: 2.4 % (ref 0.0–5.0)
Eosinophils Absolute: 0.2 10*3/uL (ref 0.0–0.7)
HEMATOCRIT: 37.3 % (ref 36.0–46.0)
Hemoglobin: 12.4 g/dL (ref 12.0–15.0)
LYMPHS ABS: 1.9 10*3/uL (ref 0.7–4.0)
Lymphocytes Relative: 21.3 % (ref 12.0–46.0)
MCHC: 33.4 g/dL (ref 30.0–36.0)
MCV: 83.1 fl (ref 78.0–100.0)
MONO ABS: 0.9 10*3/uL (ref 0.1–1.0)
MONOS PCT: 9.9 % (ref 3.0–12.0)
Neutro Abs: 5.9 10*3/uL (ref 1.4–7.7)
Neutrophils Relative %: 65.9 % (ref 43.0–77.0)
Platelets: 287 10*3/uL (ref 150.0–400.0)
RBC: 4.49 Mil/uL (ref 3.87–5.11)
RDW: 14.3 % (ref 11.5–15.5)
WBC: 9 10*3/uL (ref 4.0–10.5)

## 2014-05-20 LAB — SEDIMENTATION RATE: Sed Rate: 52 mm/hr — ABNORMAL HIGH (ref 0–22)

## 2014-05-20 LAB — POCT URINE PREGNANCY: Preg Test, Ur: NEGATIVE

## 2014-05-20 MED ORDER — POTASSIUM CHLORIDE ER 10 MEQ PO TBCR: 10.0000 meq | EXTENDED_RELEASE_TABLET | Freq: Every day | ORAL | Status: DC

## 2014-05-20 MED ORDER — CEFUROXIME AXETIL 500 MG PO TABS: 500.0000 mg | ORAL_TABLET | Freq: Two times a day (BID) | ORAL | Status: AC

## 2014-05-20 MED ORDER — CHLORTHALIDONE 25 MG PO TABS: 25.0000 mg | ORAL_TABLET | Freq: Every day | ORAL | Status: DC

## 2014-05-20 NOTE — Telephone Encounter (Signed)
Patient Information:  Caller Name: Kaitlyn Cox  Phone: [phone]  Patient: Kaitlyn Cox  Gender: Female  DOB: 29-Apr-1981  Age: 32 Years  PCP: Roxy Cedar [facility])  Pregnant: No  Office Follow Up:  Does the office need to follow up with this patient?: No  Instructions For The Office: N/A  RN Note:  Discussed with Suandrea in office and authorized to schedule appt in office today for pt---pt booked into very next available appt today; 1 hr from now.   Symptoms  Reason For Call & Symptoms: Pelvic pain with strong urine odor. No change in urine color or blood in urine. No urinary sxs. States she noticed urine odor few weeks ago but pelvic/lower abd pain onset was few days ago 5/24. Rates 5-6/10 and constant and Ibuprofen is minimally helpful.  Reviewed Health History In EMR: Yes  Reviewed Medications In EMR: Yes  Reviewed Allergies In EMR: Yes  Reviewed Surgeries / Procedures: Yes  Date of Onset of Symptoms: 05/16/2014  Treatments Tried: Ibuprofen  Treatments Tried Worked: Yes OB / GYN:  LMP: 04/23/2014  Guideline(s) Used:  Abdominal Pain - Female  Disposition Per Guideline:   Go to ED Now (or to Office with PCP Approval)  Reason For Disposition Reached:   Constant abdominal pain lasting > 2 hours  Advice Given:  N/A  Patient Will Follow Care Advice:  YES  Appointment Scheduled:  05/20/2014 13:15:00 Appointment Scheduled Provider:  Shawna Orleans, [facility] [place]) (Adults only)

## 2014-05-20 NOTE — Progress Notes (Signed)
Pre visit review using our clinic review tool, if applicable. No additional management support is needed unless otherwise documented below in the visit note. 

## 2014-05-20 NOTE — Assessment & Plan Note (Signed)
Discontinue lisinopril hydrochlorothiazide. We discussed potential fetal risk with using ACE inhibitors in the event she were to become pregnant. Switch to chlorthalidone 25 mg once daily. Patient also urged to follow low salt / heart healthy diet. Patient also encouraged to start regular aerobic exercise program. Monitor electrolytes and kidney function.  Followup with PCP in one month.  BP: 124/92 mmHg

## 2014-05-20 NOTE — Assessment & Plan Note (Addendum)
33 year old [place] female presents with suprapubic pain for 4-5 days. She has also noticed malodorous urine and suprapubic tenderness. No risk factors for STD.  Clinically she has cystitis. Treat with cefuroxime 500 mg twice daily. Increase fluid intake. Send urine for culture. Patient advised to followup within next 2-5 days if her symptoms do not improve. Other diagnostic considerations include nephrolithiasis or painful uterine fibroids.  Check CBCD, sed rate and BMET.

## 2014-05-20 NOTE — Progress Notes (Signed)
Subjective:    Patient ID: Kaitlyn Cox, female    DOB: 1981-01-17, 33 y.o.   MRN: [id number]  HPI  33 year old [place] American female with history of hypothyroidism and hypertension complains of lower abdominal pain for 4-5 days.  Patient describes "bursts" constant sharp pain mainly above suprapubic area. There is no associated vaginal symptoms or vaginal discharge. She has noticed a odor to her urine. She denies any flank pain. She denies any fever or chills.  No associated nausea vomiting constipation or diarrhea.  She is not sexually active. She has not been sexually active for 3 years. Her last menstrual period was on 04/23/2014.  Hypertension patient recently started on lisinopril hydrochlorothiazide 20/25 mg once daily she's been taking medication for one week.   Review of Systems No fever or chills, no flank pain.  No family hx of kidney stones    Past Medical History  Diagnosis Date  . Thyroid disease     hypothyroid    History   Social History  . Marital Status: Single    Spouse Name: N/A    Number of Children: N/A  . Years of Education: N/A   Occupational History  . Not on file.   Social History Main Topics  . Smoking status: Never Smoker   . Smokeless tobacco: Not on file  . Alcohol Use: No  . Drug Use: No  . Sexual Activity: Not Currently    Partners: Male    Birth Control/ Protection: Abstinence   Other Topics Concern  . Not on file   Social History Narrative  . No narrative on file    Past Surgical History  Procedure Laterality Date  . Myomectomy  2011  . Uterine fibroid surgery  5/14    Family History  Problem Relation Age of Onset  . Diabetes Mother   . Anxiety disorder Mother   . Hypertension Mother   . Anxiety disorder Father   . Hypertension Father   . Diabetes Maternal Grandmother   . Breast cancer Maternal Grandmother   . Cancer Paternal Grandfather     lung    No Known Allergies  Current Outpatient Prescriptions on  File Prior to Visit  Medication Sig Dispense Refill  . ARMOUR THYROID 30 MG tablet TAKE 1 TABLET (30 MG TOTAL) BY MOUTH DAILY.  30 tablet  1  . IBUPROFEN PO Take by mouth as needed.      . Multiple Vitamins-Minerals (MULTIVITAMIN PO) Take by mouth daily.       No current facility-administered medications on file prior to visit.    BP 124/92  Temp(Src) 99 F (37.2 C) (Oral)  Ht 5' 8.5" (1.74 m)  Wt 227 lb (102.967 kg)  BMI 34.01 kg/m2  LMP 04/23/2014  Urine pregnancy test negative  Urine dip 1+ positive for blood and protein  Objective:   Physical Exam  Constitutional: She is oriented to person, place, and time. She appears well-developed and well-nourished.  HENT:  Head: Normocephalic and atraumatic.  Right Ear: External ear normal.  Left Ear: External ear normal.  Mouth/Throat: Oropharynx is clear and moist.  Cardiovascular: Normal rate, regular rhythm and normal heart sounds.   No murmur heard. Pulmonary/Chest: Effort normal and breath sounds normal. She has no wheezes.  Abdominal: Soft. Bowel sounds are normal. She exhibits no mass. There is no rebound and no guarding.  Suprapubic tenderness  Musculoskeletal: She exhibits no edema.  Neurological: She is alert and oriented to person, place, and time. No cranial nerve  deficit.  Skin: Skin is warm and dry.  Psychiatric: She has a normal mood and affect. Her behavior is normal.          Assessment & Plan:

## 2014-05-20 NOTE — Patient Instructions (Signed)
Drink plenty of fluids Please contact our office if your symptoms do not improve or gets worse.

## 2014-05-22 LAB — URINE CULTURE: Colony Count: 25000

## 2014-05-24 ENCOUNTER — Telehealth: Payer: Self-pay | Admitting: Family

## 2014-05-24 NOTE — Telephone Encounter (Signed)
See result note.  

## 2014-05-24 NOTE — Telephone Encounter (Signed)
Pt would like results of labs done last thurs. Pt saw dr Shawna Orleans, but is a pt of padonda

## 2014-05-25 ENCOUNTER — Ambulatory Visit (INDEPENDENT_AMBULATORY_CARE_PROVIDER_SITE_OTHER): Payer: Commercial Managed Care - PPO | Admitting: Nurse Practitioner

## 2014-05-25 ENCOUNTER — Encounter: Payer: Self-pay | Admitting: Nurse Practitioner

## 2014-05-25 ENCOUNTER — Encounter: Payer: Self-pay | Admitting: Certified Nurse Midwife

## 2014-05-25 ENCOUNTER — Ambulatory Visit: Payer: Commercial Managed Care - PPO | Admitting: Certified Nurse Midwife

## 2014-05-25 VITALS — BP 130/92 | HR 88 | Ht 68.0 in | Wt 222.0 lb

## 2014-05-25 DIAGNOSIS — Z0000 Encounter for general adult medical examination without abnormal findings: Secondary | ICD-10-CM

## 2014-05-25 DIAGNOSIS — Z01419 Encounter for gynecological examination (general) (routine) without abnormal findings: Secondary | ICD-10-CM

## 2014-05-25 NOTE — Progress Notes (Signed)
Encounter reviewed by Dr. Josefa Half.  I recommend a pelvic ultrasound, consultation, and review of the operative report from the patient's last surgery in [place].

## 2014-05-25 NOTE — Progress Notes (Signed)
Patient ID: Kaitlyn Cox, female   DOB: May 18, 1981, 33 y.o.   MRN: [id number] 32 y.o. G0P0 Single African American Fe here for annual exam.  Menses are usually 6-7 days.  Heavy for 2 days using super tampon and overnight pad and changing every 2 hours. Did not get Micronor filled after last AEX.  She has not been SA for 3 years. Recent lower abdominal pressure and pain on May 24 th before this last cycle started.  By May 28 th more pain and sought care at PCP.  She is now on Ceftin for UTI.  Her urine culture was mixed bacteria.  She did have elevated Sed Rate.   She continues to have a flare of umbilical drainage.  This time flared since October.  With her previous Myomectomy X 2 this seems to be an indication that fibroids are growing again.  The MD in [place] has now retired. She is willing to see someone here. She has a phone picture of her umbilicus that shows acute inflammation and bleeding.  Patient's last menstrual period was 04/23/2014.          Sexually active: no  The current method of family planning is none.    Exercising: yes  Zumba 2-3 times per week. Smoker:  no  Health Maintenance: Pap:  05/12/12, WNL TDaP:  05/12/12 Labs: HB:  PCP 05/20/14 Urine:  PCP 05/20/14   reports that she has never smoked. She has never used smokeless tobacco. She reports that she does not drink alcohol or use illicit drugs.  Past Medical History  Diagnosis Date  . Thyroid disease     hypothyroid  . Hypertension     Past Surgical History  Procedure Laterality Date  . Myomectomy  2011  . Uterine fibroid surgery  5/14    Current Outpatient Prescriptions  Medication Sig Dispense Refill  . ARMOUR THYROID 30 MG tablet TAKE 1 TABLET (30 MG TOTAL) BY MOUTH DAILY.  30 tablet  1  . cefUROXime (CEFTIN) 500 MG tablet Take 1 tablet (500 mg total) by mouth 2 (two) times daily.  14 tablet  0  . chlorthalidone (HYGROTON) 25 MG tablet Take 1 tablet (25 mg total) by mouth daily.  90 tablet  0  . IBUPROFEN PO  Take by mouth as needed.      . Multiple Vitamins-Minerals (MULTIVITAMIN PO) Take by mouth daily.      . potassium chloride (K-DUR) 10 MEQ tablet Take 1 tablet (10 mEq total) by mouth daily.  90 tablet  1   No current facility-administered medications for this visit.    Family History  Problem Relation Age of Onset  . Diabetes Mother   . Anxiety disorder Mother   . Hypertension Mother   . Anxiety disorder Father   . Hypertension Father   . Diabetes Maternal Grandmother   . Breast cancer Maternal Grandmother   . Lung cancer Paternal Grandfather     lung    ROS:  Pertinent items are noted in HPI.  Otherwise, a comprehensive ROS was negative.  Exam:   BP 130/92  Pulse 88  Ht 5\' 8"  (1.727 m)  Wt 222 lb (100.699 kg)  BMI 33.76 kg/m2  LMP 04/23/2014 Height: 5\' 8"  (172.7 cm)  Ht Readings from Last 3 Encounters:  05/25/14 5\' 8"  (1.727 m)  05/20/14 5' 8.5" (1.74 m)  10/29/13 5' 8.5" (1.74 m)    General appearance: alert, cooperative and appears stated age Head: Normocephalic, without obvious abnormality, atraumatic Neck: no  adenopathy, supple, symmetrical, trachea midline and thyroid normal to inspection and palpation Lungs: clear to auscultation bilaterally Breasts: normal appearance, no masses or tenderness Heart: regular rate and rhythm Abdomen: soft, non-tender; no masses,  no organomegaly. She does have a crusty bloody discharge that is not as inflamed as her phone indicates a month ago. Extremities: extremities normal, atraumatic, no cyanosis or edema Skin: Skin color, texture, turgor normal. No rashes or lesions Lymph nodes: Cervical, supraclavicular, and axillary nodes normal. No abnormal inguinal nodes palpated Neurologic: Grossly normal   Pelvic: External genitalia:  no lesions              Urethra:  normal appearing urethra with no masses, tenderness or lesions              Bartholin's and Skene's: normal                 Vagina: normal appearing vagina with  normal color and discharge, no lesions              Cervix: anteverted              Pap taken: yes Bimanual Exam:  Uterus:  enlarged, 8-10 weeks size              Adnexa: no mass, fullness, tenderness               Rectovaginal: Confirms               Anus:  normal sphincter tone, no lesions  A:  Well Woman with normal exam  History of uterine fibroids  Hypertension  Hypothyroid  Umbilical lesion with bleeding  P:   Reviewed health and wellness pertinent to exam  Pap smear taken today  Counseled on breast self exam, adequate intake of calcium and vitamin D, diet and exercise return annually or prn  An After Visit Summary was printed and given to the patient.

## 2014-05-25 NOTE — Patient Instructions (Signed)

## 2014-05-26 ENCOUNTER — Telehealth: Payer: Self-pay | Admitting: Obstetrics and Gynecology

## 2014-05-26 DIAGNOSIS — R102 Pelvic and perineal pain: Secondary | ICD-10-CM

## 2014-05-26 NOTE — Telephone Encounter (Signed)
Left message to call Judea Fennimore at [phone]. 

## 2014-05-26 NOTE — Telephone Encounter (Signed)
Spoke with patient. Advised of OOP cost for PUS scheduled for tomorrow of $465.96 per [place]. Advised patient having to make adjustments to the ultrasound schedule. Ultrasound rescheduled to tomorrow at 10:30am and a consult with Dr.Silva at 11:00am. Patient agreeable and verbalizes understanding. Patient states " I will not have the 465.96 tomorrow. Is this something that I will be billed for or do I have to have it tomorrow? I also have care credit if you guys accept that." Advised patient that payment is due at appointment time. Advised would speak with billing office about care credit and give patient a call back. Patient agreeable.

## 2014-05-26 NOTE — Telephone Encounter (Signed)
Kaitlyn Cox,  Please make sure that the patient understands that there is a cancellation fee for cancelling the appointment tomorrow or does not show up for the appointment.   If there is a significant financial barrier, we can refer her to the [facility] for care.

## 2014-05-26 NOTE — Telephone Encounter (Signed)
Spoke with radiology at [facility] hospital. Order's placed for ultrasound. Requesting earliest available appointment for ultrasound. Appointment scheduled for 7:30am on 6/8 as this is their earliest available appointment. Made call to patient to inform of appointment time and date.Left message to call [place] at [phone].    Dr.Silva, okay for patient to wait until Monday at 7:30am for ultrasound as this was earliest available appointment?

## 2014-05-26 NOTE — Telephone Encounter (Signed)
Patient is having pain in her right side. Was in yesterday for aex and seen patty she told patty about the pain. Michela Pitcher that it got really bad last night and when she went to pee a "bloody mass" came out. She kept it and put in on ice in the fridge and wants to bring it in to get it tested to make sure it is ok. Michela Pitcher it was about the size of her finger.

## 2014-05-26 NOTE — Telephone Encounter (Signed)
Left message to call Kaitlyn at [phone]. 

## 2014-05-26 NOTE — Telephone Encounter (Signed)
Spoke with patient. Patient states that she has changed her mind and would like to have ultrasound done at [facility]. Advised patient that I would call over to the hospital and get her an appointment with them and give her a call back with appointment date and time. Patient states that any time during the day if fine for appointment.

## 2014-05-26 NOTE — Telephone Encounter (Signed)
Spoke with patient. Advised that we do not except care credit at our office or future billing. Advised we can refer her to the [facility] for care and there she will be able to have other billing options. Patient states that she does not want to do this at this time. Requesting an appointment with Dr.Silva for further evaluation before she proceeds with ultrasound. Ultrasound appointment cancelled for tomorrow per patient request. Advised patient that I would send a message over to Dr.Silva regarding best date and time for patient to be seen. Patient agreeable.   Dr.Silva, patient requesting to come in to be seen by you for evaluation. Patient would like to come soon to be seen. Is there a place in your schedule before the end of this week that we could place this patient in to be seen?

## 2014-05-26 NOTE — Telephone Encounter (Signed)
Spoke with patient. Patient was seen yesterday for AEX with Milford Cage, FNP. Patient states that she has been having pelvic pain since Sunday. Was seen by PCP and placed on antibiotic for UTI. Denies burning with urination, urinary frequency, or pressure. LMP began May 27th. Patient states that bleeding had slowed down and then last night pelvic pain increased and she went to the restroom and passed a "Blood clot that was the length and width of a finger. It was thick and solid. I kept it in a cup in the freezer because I have never seen anything like this before."  Patient denies abdominal pain at this time. Patient is still bleeding but states "I was bleeding more last night and now it is like my regular period." Advised patient per notes from [place] it is recommended that patient be seen for PUS and consultation. Also spoke with Regina Eck CNM who recommends PUS per Dr.Silva as long as pain and bleeding have not increased. Patient is agreeable. PUS scheduled for tomorrow at 9am with a consult with Dr.Silva at 9:30am. Advised patient that if pain returns or bleeding increases to having to change pad or tampon every 1-2 hours because of bleeding through she will need to call back to be seen in office today or seek immediate care. Patient agreeable and verbalizes understanding.  Routing to provider for final review. Patient agreeable to disposition. Will close encounter  Routing to Dr.Silva as covering and FYI CC: Milford Cage, FNP

## 2014-05-26 NOTE — Telephone Encounter (Signed)
OK to proceed with the ultrasound as scheduled. She will need a follow up appointment in office with me.  Thanks.

## 2014-05-27 ENCOUNTER — Other Ambulatory Visit: Payer: Commercial Managed Care - PPO | Admitting: Obstetrics and Gynecology

## 2014-05-27 ENCOUNTER — Telehealth: Payer: Self-pay | Admitting: Obstetrics and Gynecology

## 2014-05-27 ENCOUNTER — Ambulatory Visit (INDEPENDENT_AMBULATORY_CARE_PROVIDER_SITE_OTHER): Payer: Commercial Managed Care - PPO | Admitting: Obstetrics and Gynecology

## 2014-05-27 ENCOUNTER — Other Ambulatory Visit: Payer: Commercial Managed Care - PPO

## 2014-05-27 ENCOUNTER — Encounter: Payer: Self-pay | Admitting: Obstetrics and Gynecology

## 2014-05-27 VITALS — BP 122/80 | HR 84 | Ht 68.0 in | Wt 224.0 lb

## 2014-05-27 DIAGNOSIS — R198 Other specified symptoms and signs involving the digestive system and abdomen: Secondary | ICD-10-CM

## 2014-05-27 NOTE — Progress Notes (Signed)
Patient ID: Kaitlyn Cox, female   DOB: November 28, 1981, 33 y.o.   MRN: [id number] GYNECOLOGY VISIT  PCP:   Posey Pronto, MD  Referring provider:   HPI: 33 y.o.   Single  African American  female   G0P0 with Patient's last menstrual period was 05/19/2014.   here for Right sided abdominal pain x several weeks. Started on May 13, 2014. Saw PCP and was treated for UTI.    Just finished Ceftin for UTI.  Last dose was last night.  UC on 05/20/14 showed 25, 000 colonies of mixed bacteria.  Still feeling pain, constant, throbbing and crampy. Concentrated in the right lower quadrant but can shift to the left as well. Ibuprofen helps pain.  Movement makes it worse.  Passed a clot at home and brings it in for evaluation.  Hgb 12.8 two weeks ago.   No nausea or vomiting.  Stools soft.  Fever on the day of visit with PCP.  T was 99.9.    No dysuria.   Menses have on time.   Did not take Micronor after annual exam in 2014.   Not sexually active since end of 2012.   Umbilical drainage.  Had laparoscopy for this in [place] - saw only fibroids and they were removed.  History of previous myomectomy and endometriosis.   Hgb:   Urine:  Trace protein.   GYNECOLOGIC HISTORY: Patient's last menstrual period was 05/19/2014. Sexually active:  no Partner preference: female Contraception: abstinence   Menopausal hormone therapy: n/a DES exposure:  no Blood transfusions: no   Sexually transmitted diseases:   no GYN procedures and prior surgeries:  Myomectomy 2011 and 2014. Last mammogram:    n/a             Last pap and high risk HPV testing:   05-12-12 wnl History of abnormal pap smear:  no   OB History   Grav Para Term Preterm Abortions TAB SAB Ect Mult Living   0 0               LIFESTYLE: Exercise:   Zumba 2-3 times per week            Tobacco:   no Alcohol:      no Drug use:  no  Patient Active Problem List   Diagnosis Date Noted  . Abdominal pain 05/20/2014  .  Unspecified hypothyroidism 10/29/2013  . Hypertension 10/29/2013  . Hyperglycemia 10/29/2013    Past Medical History  Diagnosis Date  . Thyroid disease     hypothyroid  . Hypertension     Past Surgical History  Procedure Laterality Date  . Myomectomy  2011  . Uterine fibroid surgery  5/14    Current Outpatient Prescriptions  Medication Sig Dispense Refill  . ARMOUR THYROID 30 MG tablet TAKE 1 TABLET (30 MG TOTAL) BY MOUTH DAILY.  30 tablet  1  . chlorthalidone (HYGROTON) 25 MG tablet Take 1 tablet (25 mg total) by mouth daily.  90 tablet  0  . IBUPROFEN PO Take by mouth as needed.      . Multiple Vitamins-Minerals (MULTIVITAMIN PO) Take by mouth daily.      . potassium chloride (K-DUR) 10 MEQ tablet Take 1 tablet (10 mEq total) by mouth daily.  90 tablet  1  . cefUROXime (CEFTIN) 500 MG tablet Take 1 tablet (500 mg total) by mouth 2 (two) times daily.  14 tablet  0   No current facility-administered medications for this visit.  ALLERGIES: Review of patient's allergies indicates no known allergies.  Family History  Problem Relation Age of Onset  . Diabetes Mother   . Anxiety disorder Mother   . Hypertension Mother   . Anxiety disorder Father   . Hypertension Father   . Diabetes Maternal Grandmother   . Breast cancer Maternal Grandmother   . Lung cancer Paternal Grandfather     lung    History   Social History  . Marital Status: Single    Spouse Name: N/A    Number of Children: N/A  . Years of Education: N/A   Occupational History  . Not on file.   Social History Main Topics  . Smoking status: Never Smoker   . Smokeless tobacco: Never Used  . Alcohol Use: No  . Drug Use: No  . Sexual Activity: Not Currently    Partners: Male    Birth Control/ Protection: Abstinence   Other Topics Concern  . Not on file   Social History Narrative  . No narrative on file    ROS:  Pertinent items are noted in HPI.  PHYSICAL EXAMINATION:    BP 122/80  Pulse 84   Ht 5\' 8"  (1.727 m)  Wt 224 lb (101.606 kg)  BMI 34.07 kg/m2  LMP 05/19/2014   Wt Readings from Last 3 Encounters:  05/27/14 224 lb (101.606 kg)  05/25/14 222 lb (100.699 kg)  05/20/14 227 lb (102.967 kg)     Ht Readings from Last 3 Encounters:  05/27/14 5\' 8"  (1.727 m)  05/25/14 5\' 8"  (1.727 m)  05/20/14 5' 8.5" (1.74 m)    General appearance: alert, cooperative and appears stated age Lungs: clear to auscultation bilaterally Heart: regular rate and rhythm Abdomen: Umbilical incision with granulation tissue versus endometriosis - bleeding slightly. soft, non-tender; no masses,  no organomegaly Extremities: extremities normal, atraumatic, no cyanosis or edema Skin: Skin color, texture, turgor normal. No rashes or lesions Lymph nodes: Cervical, supraclavicular, and axillary nodes normal. No abnormal inguinal nodes palpated Neurologic: Grossly normal  Pelvic: External genitalia:  no lesions              Urethra:  normal appearing urethra with no masses, tenderness or lesions              Bartholins and Skenes: normal                 Vagina: normal appearing vagina with normal color and discharge, no lesions              Cervix: normal appearance                 Bimanual Exam:  Uterus:  uterus is globular and 9 week size, slightly tender.  Cervical fibroid?                                      Adnexa: normal adnexa in size, nontender and no masses                                      Rectovaginal: Confirms                                      Anus:  normal sphincter tone, no lesions  Clot examined - does not look like a decidual cast.   ASSESSMENT  Pelvic pain.  Uterine fibroids suspected.  Umbilical bleeding likely due to endometriosis.  PLAN  Has ultrasound for the [facility] in 4 days. Patient will need to return following this for a discussion of treatment of the above which could include medical or surgical care.   An After Visit Summary was printed and given  to the patient.  25 minutes face to face time of which over 50% was spent in counseling.

## 2014-05-27 NOTE — Telephone Encounter (Signed)
Pt states Dr.Silva wants to see her next week after her ultrasound on Monday 05-31-14. There are no available appointments please schedule.

## 2014-05-27 NOTE — Addendum Note (Signed)
Addended by: Kem Boroughs R on: 05/27/2014 12:44 PM   Modules accepted: Orders

## 2014-05-27 NOTE — Telephone Encounter (Signed)
Spoke with patient. Advised ultrasound scheduled at [facility] on Monday 6/8 at 7:30am. Patient agreeable to date and time. Patient states that she is still in 6/10 pain. Pain is located on the right side. Patient is having light bleeding on day 7 of her cycle. Wearing a pad which she changes twice daily.Patient has been taking ibuprofen for pain with no relief. Patient would like to come in to be seen today. Advised patient that with 6/10 pain we would like her to be seen as soon as possible. Patient states that she has to be at work this morning. Offered patient appointment with Milford Cage, FNP at 10:15am and reinforced importance of being seen. Patient requesting to see a doctor within our practice. Appointment scheduled today with Dr.Silva at 11:00am. Patient agreeable to date and time. Patient will still need follow up scheduled after ultrasound on Monday.   Routing to provider for final review. Patient agreeable to disposition. Will close encounter.

## 2014-05-28 NOTE — Telephone Encounter (Signed)
Spoke with pt to schedule consult after her PUS that Dr. Quincy Simmonds ordered. Pt has decided to have PUS done on 06-07-14  in [place] at her OB's office for financial reasons as it is cheaper for her to have it there. She will have results sent to Dr. Quincy Simmonds. Scheduled appt 06-09-14 at 3 pm with BS to discuss results.

## 2014-05-28 NOTE — Telephone Encounter (Signed)
Please cancel her ultrasound at the [facility] for 05/31/14.

## 2014-05-28 NOTE — Telephone Encounter (Signed)
EPIC shows appt has been cancelled already.

## 2014-05-31 ENCOUNTER — Ambulatory Visit (HOSPITAL_COMMUNITY): Payer: Commercial Managed Care - PPO

## 2014-05-31 LAB — IPS PAP TEST WITH HPV

## 2014-06-02 ENCOUNTER — Ambulatory Visit: Payer: Commercial Managed Care - PPO | Admitting: Obstetrics and Gynecology

## 2014-06-08 NOTE — Telephone Encounter (Signed)
Patient canceled her appointment for consult "PUS results"  06/09/14. Patient does not wish to reschedule.

## 2014-06-08 NOTE — Telephone Encounter (Signed)
Thank you for the update. I believe patient is pursuing her ultrasound in her [facility] office due to cost concerns.  I will close the encounter.

## 2014-06-09 ENCOUNTER — Institutional Professional Consult (permissible substitution): Payer: Commercial Managed Care - PPO | Admitting: Obstetrics and Gynecology

## 2014-06-15 ENCOUNTER — Encounter: Payer: Self-pay | Admitting: Family

## 2014-06-15 ENCOUNTER — Ambulatory Visit (INDEPENDENT_AMBULATORY_CARE_PROVIDER_SITE_OTHER): Payer: Commercial Managed Care - PPO | Admitting: Family

## 2014-06-15 ENCOUNTER — Telehealth: Payer: Self-pay | Admitting: Family

## 2014-06-15 VITALS — BP 120/82 | HR 109 | Temp 98.7°F | Wt 226.0 lb

## 2014-06-15 DIAGNOSIS — E039 Hypothyroidism, unspecified: Secondary | ICD-10-CM

## 2014-06-15 DIAGNOSIS — I10 Essential (primary) hypertension: Secondary | ICD-10-CM

## 2014-06-15 MED ORDER — CHLORTHALIDONE 25 MG PO TABS: 25.0000 mg | ORAL_TABLET | Freq: Every day | ORAL | Status: DC

## 2014-06-15 MED ORDER — THYROID 30 MG PO TABS: ORAL_TABLET | ORAL | Status: DC

## 2014-06-15 NOTE — Telephone Encounter (Signed)
Pt is needing new rx ARMOUR THYROID 30 MG tablet, sent to [facility]

## 2014-06-15 NOTE — Patient Instructions (Signed)
Hypothyroidism The thyroid is a large gland located in the lower front of your neck. The thyroid gland helps control metabolism. Metabolism is how your body handles food. It controls metabolism with the hormone thyroxine. When this gland is underactive (hypothyroid), it produces too little hormone.  CAUSES These include:   Absence or destruction of thyroid tissue.  Goiter due to iodine deficiency.  Goiter due to medications.  Congenital defects (since birth).  Problems with the pituitary. This causes a lack of TSH (thyroid stimulating hormone). This hormone tells the thyroid to turn out more hormone. SYMPTOMS  Lethargy (feeling as though you have no energy)  Cold intolerance  Weight gain (in spite of normal food intake)  Dry skin  Coarse hair  Menstrual irregularity (if severe, may lead to infertility)  Slowing of thought processes Cardiac problems are also caused by insufficient amounts of thyroid hormone. Hypothyroidism in the newborn is cretinism, and is an extreme form. It is important that this form be treated adequately and immediately or it will lead rapidly to retarded physical and mental development. DIAGNOSIS  To prove hypothyroidism, your caregiver may do blood tests and ultrasound tests. Sometimes the signs are hidden. It may be necessary for your caregiver to watch this illness with blood tests either before or after diagnosis and treatment. TREATMENT  Low levels of thyroid hormone are increased by using synthetic thyroid hormone. This is a safe, effective treatment. It usually takes about four weeks to gain the full effects of the medication. After you have the full effect of the medication, it will generally take another four weeks for problems to leave. Your caregiver may start you on low doses. If you have had heart problems the dose may be gradually increased. It is generally not an emergency to get rapidly to normal. HOME CARE INSTRUCTIONS   Take your  medications as your caregiver suggests. Let your caregiver know of any medications you are taking or start taking. Your caregiver will help you with dosage schedules.  As your condition improves, your dosage needs may increase. It will be necessary to have continuing blood tests as suggested by your caregiver.  Report all suspected medication side effects to your caregiver. SEEK MEDICAL CARE IF: Seek medical care if you develop:  Sweating.  Tremulousness (tremors).  Anxiety.  Rapid weight loss.  Heat intolerance.  Emotional swings.  Diarrhea.  Weakness. SEEK IMMEDIATE MEDICAL CARE IF:  You develop chest pain, an irregular heart beat (palpitations), or a rapid heart beat. MAKE SURE YOU:   Understand these instructions.  Will watch your condition.  Will get help right away if you are not doing well or get worse. Document Released: 12/10/2005 Document Revised: 03/03/2012 Document Reviewed: 07/30/2008 ExitCare Patient Information 2015 [employer]. This information is not intended to replace advice given to you by your health care provider. Make sure you discuss any questions you have with your health care provider.  

## 2014-06-15 NOTE — Progress Notes (Signed)
Subjective:    Patient ID: Kaitlyn Cox, female    DOB: 02-06-1981, 33 y.o.   MRN: [id number]  HPI 33 y.o. African American female presents today for blood pressure recheck after being switched to Chlorthalidone. Pt blood pressure is currently 120/82. Pt denies heart palpitations, headaches, vision changes and shortness of breath. Denies fever, chills, fatigue and change in appetite.     Review of Systems  Constitutional: Negative.   HENT: Negative.   Eyes: Negative.   Respiratory: Negative.   Cardiovascular: Negative.   Gastrointestinal: Negative.   Endocrine: Negative.   Musculoskeletal: Negative.   Skin: Negative.   Allergic/Immunologic: Negative.   Neurological: Negative.   Hematological: Negative.   Psychiatric/Behavioral: Negative.    Past Medical History  Diagnosis Date  . Thyroid disease     hypothyroid  . Hypertension     History   Social History  . Marital Status: Single    Spouse Name: N/A    Number of Children: N/A  . Years of Education: N/A   Occupational History  . Not on file.   Social History Main Topics  . Smoking status: Never Smoker   . Smokeless tobacco: Never Used  . Alcohol Use: No  . Drug Use: No  . Sexual Activity: Not Currently    Partners: Male    Birth Control/ Protection: Abstinence   Other Topics Concern  . Not on file   Social History Narrative  . No narrative on file    Past Surgical History  Procedure Laterality Date  . Myomectomy  2011  . Uterine fibroid surgery  5/14    Family History  Problem Relation Age of Onset  . Diabetes Mother   . Anxiety disorder Mother   . Hypertension Mother   . Anxiety disorder Father   . Hypertension Father   . Diabetes Maternal Grandmother   . Breast cancer Maternal Grandmother   . Lung cancer Paternal Grandfather     lung    No Known Allergies  Current Outpatient Prescriptions on File Prior to Visit  Medication Sig Dispense Refill  . ARMOUR THYROID 30 MG tablet TAKE 1  TABLET (30 MG TOTAL) BY MOUTH DAILY.  30 tablet  1  . IBUPROFEN PO Take by mouth as needed.      . Multiple Vitamins-Minerals (MULTIVITAMIN PO) Take by mouth daily.      . potassium chloride (K-DUR) 10 MEQ tablet Take 1 tablet (10 mEq total) by mouth daily.  90 tablet  1   No current facility-administered medications on file prior to visit.    BP 120/82  Pulse 109  Temp(Src) 98.7 F (37.1 C) (Oral)  Wt 226 lb (102.513 kg)  LMP 05/27/2015chart    Objective:   Physical Exam  Constitutional: She is oriented to person, place, and time. She appears well-developed and well-nourished. She is active.  Neck: Trachea normal, normal range of motion and full passive range of motion without pain. Neck supple.  Cardiovascular: Normal rate, regular rhythm, normal heart sounds and normal pulses.   Pulmonary/Chest: Effort normal and breath sounds normal.  Abdominal: Soft. Normal appearance and bowel sounds are normal.  Neurological: She is alert and oriented to person, place, and time.  Skin: Skin is warm, dry and intact.  Psychiatric: She has a normal mood and affect. Her speech is normal and behavior is normal.          Assessment & Plan:  Kaitlyn Cox was seen today for follow-up.  Diagnoses and associated orders for  this visit:  Unspecified essential hypertension  Unspecified hypothyroidism  Other Orders - chlorthalidone (HYGROTON) 25 MG tablet; Take 1 tablet (25 mg total) by mouth daily.   Education: Will recheck thyroid at 6 month mark. Continue medications as prescribed until that time.    Follow up: 4 months for TSH recheck.

## 2014-06-15 NOTE — Progress Notes (Signed)
Pre visit review using our clinic review tool, if applicable. No additional management support is needed unless otherwise documented below in the visit note. 

## 2014-06-16 ENCOUNTER — Other Ambulatory Visit: Payer: Self-pay | Admitting: Internal Medicine

## 2014-06-19 ENCOUNTER — Encounter: Payer: Self-pay | Admitting: Family

## 2014-06-21 ENCOUNTER — Ambulatory Visit: Payer: Commercial Managed Care - PPO | Admitting: Obstetrics and Gynecology

## 2014-07-11 ENCOUNTER — Other Ambulatory Visit: Payer: Self-pay | Admitting: Family

## 2014-07-17 ENCOUNTER — Other Ambulatory Visit: Payer: Self-pay | Admitting: Internal Medicine

## 2014-07-18 ENCOUNTER — Other Ambulatory Visit: Payer: Self-pay | Admitting: Family

## 2014-07-20 ENCOUNTER — Telehealth: Payer: Self-pay | Admitting: Family

## 2014-07-20 MED ORDER — CHLORTHALIDONE 25 MG PO TABS: 25.0000 mg | ORAL_TABLET | Freq: Every day | ORAL | Status: DC

## 2014-07-20 NOTE — Telephone Encounter (Signed)
Done

## 2014-07-20 NOTE — Telephone Encounter (Signed)
Pt would like to transfer Rx chlorthalidone (HYGROTON) 25 MG tablet  From [facility] to [facility] mail order [phone] fx.  Best number to call pt is [phone]

## 2014-08-18 ENCOUNTER — Other Ambulatory Visit: Payer: Self-pay

## 2014-08-18 MED ORDER — THYROID 30 MG PO TABS: ORAL_TABLET | ORAL | Status: DC

## 2014-08-18 MED ORDER — POTASSIUM CHLORIDE ER 10 MEQ PO TBCR: 10.0000 meq | EXTENDED_RELEASE_TABLET | Freq: Every day | ORAL | Status: DC

## 2014-08-20 ENCOUNTER — Encounter: Payer: Self-pay | Admitting: Obstetrics and Gynecology

## 2014-09-14 ENCOUNTER — Other Ambulatory Visit: Payer: Self-pay | Admitting: Family

## 2014-10-15 ENCOUNTER — Ambulatory Visit: Payer: Commercial Managed Care - PPO | Admitting: Family

## 2014-11-24 ENCOUNTER — Ambulatory Visit: Payer: Commercial Managed Care - PPO | Admitting: Family

## 2014-12-10 ENCOUNTER — Encounter: Payer: Self-pay | Admitting: Family

## 2014-12-10 ENCOUNTER — Ambulatory Visit (INDEPENDENT_AMBULATORY_CARE_PROVIDER_SITE_OTHER): Payer: Commercial Managed Care - PPO | Admitting: Family

## 2014-12-10 VITALS — BP 112/62 | HR 78 | Wt 233.2 lb

## 2014-12-10 DIAGNOSIS — L700 Acne vulgaris: Secondary | ICD-10-CM | POA: Insufficient documentation

## 2014-12-10 DIAGNOSIS — R739 Hyperglycemia, unspecified: Secondary | ICD-10-CM

## 2014-12-10 DIAGNOSIS — E039 Hypothyroidism, unspecified: Secondary | ICD-10-CM

## 2014-12-10 DIAGNOSIS — I10 Essential (primary) hypertension: Secondary | ICD-10-CM

## 2014-12-10 LAB — COMPREHENSIVE METABOLIC PANEL
ALK PHOS: 48 U/L (ref 39–117)
ALT: 16 U/L (ref 0–35)
AST: 14 U/L (ref 0–37)
Albumin: 4.2 g/dL (ref 3.5–5.2)
BUN: 15 mg/dL (ref 6–23)
CO2: 28 mEq/L (ref 19–32)
Calcium: 9.5 mg/dL (ref 8.4–10.5)
Chloride: 103 mEq/L (ref 96–112)
Creat: 0.84 mg/dL (ref 0.50–1.10)
Glucose, Bld: 98 mg/dL (ref 70–99)
POTASSIUM: 3.9 meq/L (ref 3.5–5.3)
SODIUM: 136 meq/L (ref 135–145)
TOTAL PROTEIN: 7.3 g/dL (ref 6.0–8.3)
Total Bilirubin: 0.4 mg/dL (ref 0.2–1.2)

## 2014-12-10 MED ORDER — TRETINOIN 0.025 % EX CREA: TOPICAL_CREAM | Freq: Every day | CUTANEOUS | Status: DC

## 2014-12-10 MED ORDER — CHLORTHALIDONE 25 MG PO TABS: 25.0000 mg | ORAL_TABLET | Freq: Every day | ORAL | Status: DC

## 2014-12-10 MED ORDER — POTASSIUM CHLORIDE ER 10 MEQ PO TBCR: 10.0000 meq | EXTENDED_RELEASE_TABLET | Freq: Every day | ORAL | Status: DC

## 2014-12-10 MED ORDER — THYROID 30 MG PO TABS
ORAL_TABLET | ORAL | Status: DC
Start: 2014-12-10 — End: 2015-05-13

## 2014-12-10 NOTE — Progress Notes (Signed)
Pre visit review using our clinic review tool, if applicable. No additional management support is needed unless otherwise documented below in the visit note. 

## 2014-12-10 NOTE — Patient Instructions (Signed)
Acne  Acne is a skin problem that causes pimples. Acne occurs when the pores in your skin get blocked. Your pores may become red, sore, and swollen (inflamed), or infected with a common skin bacterium (Propionibacterium acnes). Acne is a common skin problem. Up to 80% of people get acne at some time. Acne is especially common from the ages of 12 to 24. Acne usually goes away over time with proper treatment.  CAUSES   Your pores each contain an oil gland. The oil glands make an oily substance called sebum. Acne happens when these glands get plugged with sebum, dead skin cells, and dirt. The P. acnes bacteria that are normally found in the oil glands then multiply, causing inflammation. Acne is commonly triggered by changes in your hormones. These hormonal changes can cause the oil glands to get bigger and to make more sebum. Factors that can make acne worse include:   Hormone changes during adolescence.   Hormone changes during women's menstrual cycles.   Hormone changes during pregnancy.   Oil-based cosmetics and hair products.   Harshly scrubbing the skin.   Strong soaps.   Stress.   Hormone problems due to certain diseases.   Long or oily hair rubbing against the skin.   Certain medicines.   Pressure from headbands, backpacks, or shoulder pads.   Exposure to certain oils and chemicals.  SYMPTOMS   Acne often occurs on the face, neck, chest, and upper back. Symptoms include:   Small, red bumps (pimples or papules).   Whiteheads (closed comedones).   Blackheads (open comedones).   Small, pus-filled pimples (pustules).   Big, red pimples or pustules that feel tender.  More severe acne can cause:   An infected area that contains a collection of pus (abscess).   Hard, painful, fluid-filled sacs (cysts).   Scars.  DIAGNOSIS   Your caregiver can usually tell what the problem is by doing a physical exam.  TREATMENT   There are many good treatments for acne. Some are available over the counter and some  are available with a prescription. The treatment that is best for you depends on the type of acne you have and how severe it is. It may take 2 months of treatment before your acne gets better. Common treatments include:   Creams and lotions that prevent oil glands from clogging.   Creams and lotions that treat or prevent infections and inflammation.   Antibiotics applied to the skin or taken as a pill.   Pills that decrease sebum production.   Birth control pills.   Light or laser treatments.   Minor surgery.   Injections of medicine into the affected areas.   Chemicals that cause peeling of the skin.  HOME CARE INSTRUCTIONS   Good skin care is the most important part of treatment.   Wash your skin gently at least twice a day and after exercise. Always wash your skin before bed.   Use mild soap.   After each wash, apply a water-based skin moisturizer.   Keep your hair clean and off of your face. Shampoo your hair daily.   Only take medicines as directed by your caregiver.   Use a sunscreen or sunblock with SPF 30 or greater. This is especially important when you are using acne medicines.   Choose cosmetics that are noncomedogenic. This means they do not plug the oil glands.   Avoid leaning your chin or forehead on your hands.   Avoid wearing tight headbands or hats.     Avoid picking or squeezing your pimples. This can make your acne worse and cause scarring.  SEEK MEDICAL CARE IF:    Your acne is not better after 8 weeks.   Your acne gets worse.   You have a large area of skin that is red or tender.  Document Released: 12/07/2000 Document Revised: 04/26/2014 Document Reviewed: 09/28/2011  ExitCare Patient Information 2015 [employer]. This information is not intended to replace advice given to you by your health care provider. Make sure you discuss any questions you have with your health care provider.

## 2014-12-10 NOTE — Progress Notes (Signed)
Subjective:    Patient ID: Kaitlyn Cox, female    DOB: 1981/10/12, 33 y.o.   MRN: [id number]  HPI 33 year old African-American female, nonsmoker with a history of hypothyroidism, hypokalemia, hypertension is in today for recheck. Reports doing well. Has concerns of acne. Reports in the past she's used Retin-A visit working well. Requesting a prescription for right made. No concerns of pregnancy.   Review of Systems  Constitutional: Negative.   HENT: Negative.   Respiratory: Negative.   Cardiovascular: Negative.   Gastrointestinal: Negative.   Endocrine: Negative.   Genitourinary: Negative.   Musculoskeletal: Negative.   Skin: Negative.  Negative for color change, pallor and wound.       Acne   Neurological: Negative.   Hematological: Negative.   Psychiatric/Behavioral: Negative.    Past Medical History  Diagnosis Date  . Thyroid disease     hypothyroid  . Hypertension     History   Social History  . Marital Status: Single    Spouse Name: N/A    Number of Children: N/A  . Years of Education: N/A   Occupational History  . Not on file.   Social History Main Topics  . Smoking status: Never Smoker   . Smokeless tobacco: Never Used  . Alcohol Use: No  . Drug Use: No  . Sexual Activity:    Partners: Male    Birth Control/ Protection: Abstinence   Other Topics Concern  . Not on file   Social History Narrative    Past Surgical History  Procedure Laterality Date  . Myomectomy  2011  . Uterine fibroid surgery  5/14    Family History  Problem Relation Age of Onset  . Diabetes Mother   . Anxiety disorder Mother   . Hypertension Mother   . Anxiety disorder Father   . Hypertension Father   . Diabetes Maternal Grandmother   . Breast cancer Maternal Grandmother   . Lung cancer Paternal Grandfather     lung    No Known Allergies  Current Outpatient Prescriptions on File Prior to Visit  Medication Sig Dispense Refill  . IBUPROFEN PO Take by mouth as  needed.    . Multiple Vitamins-Minerals (MULTIVITAMIN PO) Take by mouth daily.     No current facility-administered medications on file prior to visit.    BP 112/62 mmHg  Pulse 78  Wt 233 lb 3.2 oz (105.779 kg)chart    Objective:   Physical Exam  Constitutional: She is oriented to person, place, and time. She appears well-developed and well-nourished.  HENT:  Right Ear: External ear normal.  Left Ear: External ear normal.  Nose: Nose normal.  Mouth/Throat: Oropharynx is clear and moist.  Neck: Normal range of motion. Neck supple. No thyromegaly present.  Cardiovascular: Normal rate, regular rhythm and normal heart sounds.   Pulmonary/Chest: Effort normal and breath sounds normal.  Abdominal: Soft. Bowel sounds are normal.  Musculoskeletal: Normal range of motion.  Neurological: She is alert and oriented to person, place, and time.  Skin: Skin is warm and dry.  Mild acne noted  Psychiatric: She has a normal mood and affect.          Assessment & Plan:  Kately was seen today for follow-up.  Diagnoses and associated orders for this visit:  Hypothyroidism, unspecified hypothyroidism type - TSH  Hyperglycemia - Hemoglobin A1c - CMP  Essential hypertension - CMP  Acne vulgaris - TSH  Other Orders - chlorthalidone (HYGROTON) 25 MG tablet; Take 1 tablet (25 mg  total) by mouth daily. - potassium chloride (K-DUR) 10 MEQ tablet; Take 1 tablet (10 mEq total) by mouth daily. - thyroid (ARMOUR THYROID) 30 MG tablet; TAKE 1 TABLET BY MOUTH EVERY DAY - tretinoin (RETIN-A) 0.025 % cream; Apply topically at bedtime.    Continue current medications. Call the office with any questions or concerns. Recheck in 6 months and sooner as needed.

## 2014-12-11 LAB — HEMOGLOBIN A1C
Hgb A1c MFr Bld: 6.1 % — ABNORMAL HIGH (ref <5.7)
Mean Plasma Glucose: 128 mg/dL — ABNORMAL HIGH (ref <117)

## 2014-12-11 LAB — TSH: TSH: 1.076 u[IU]/mL (ref 0.350–4.500)

## 2014-12-21 ENCOUNTER — Telehealth: Payer: Self-pay | Admitting: Family

## 2014-12-21 MED ORDER — TRETINOIN 0.025 % EX CREA: TOPICAL_CREAM | Freq: Every day | CUTANEOUS | Status: DC

## 2014-12-21 NOTE — Telephone Encounter (Signed)
Pt request refill tretinoin (RETIN-A) 0.025 % cream Pt never picked [facility] from [facility], too expensive.  Woud like you to [facility]

## 2014-12-21 NOTE — Telephone Encounter (Signed)
Done

## 2014-12-28 ENCOUNTER — Encounter: Payer: Self-pay | Admitting: Family

## 2015-05-12 ENCOUNTER — Ambulatory Visit: Payer: Commercial Managed Care - PPO | Admitting: Family Medicine

## 2015-05-13 ENCOUNTER — Ambulatory Visit (INDEPENDENT_AMBULATORY_CARE_PROVIDER_SITE_OTHER): Payer: Commercial Managed Care - PPO | Admitting: Family

## 2015-05-13 ENCOUNTER — Encounter: Payer: Self-pay | Admitting: Family

## 2015-05-13 VITALS — BP 118/60 | HR 90 | Temp 98.5°F | Ht 68.0 in | Wt 236.1 lb

## 2015-05-13 DIAGNOSIS — Z0000 Encounter for general adult medical examination without abnormal findings: Secondary | ICD-10-CM

## 2015-05-13 DIAGNOSIS — R739 Hyperglycemia, unspecified: Secondary | ICD-10-CM | POA: Diagnosis not present

## 2015-05-13 DIAGNOSIS — E039 Hypothyroidism, unspecified: Secondary | ICD-10-CM | POA: Diagnosis not present

## 2015-05-13 DIAGNOSIS — E663 Overweight: Secondary | ICD-10-CM | POA: Diagnosis not present

## 2015-05-13 DIAGNOSIS — L700 Acne vulgaris: Secondary | ICD-10-CM

## 2015-05-13 LAB — LIPID PANEL
CHOLESTEROL: 236 mg/dL — AB (ref 0–200)
HDL: 36.8 mg/dL — ABNORMAL LOW (ref >39.00)
LDL CALC: 163 mg/dL — AB (ref 0–99)
NonHDL: 199.2
Total CHOL/HDL Ratio: 6
Triglycerides: 179 mg/dL — ABNORMAL HIGH (ref 0.0–149.0)
VLDL: 35.8 mg/dL (ref 0.0–40.0)

## 2015-05-13 LAB — CBC WITH DIFFERENTIAL/PLATELET
Basophils Absolute: 0 10*3/uL (ref 0.0–0.1)
Basophils Relative: 0.8 % (ref 0.0–3.0)
Eosinophils Absolute: 0.2 10*3/uL (ref 0.0–0.7)
Eosinophils Relative: 3.4 % (ref 0.0–5.0)
HEMATOCRIT: 38.1 % (ref 36.0–46.0)
Hemoglobin: 12.9 g/dL (ref 12.0–15.0)
LYMPHS ABS: 1.7 10*3/uL (ref 0.7–4.0)
Lymphocytes Relative: 26.3 % (ref 12.0–46.0)
MCHC: 33.9 g/dL (ref 30.0–36.0)
MCV: 82.4 fl (ref 78.0–100.0)
MONOS PCT: 9.9 % (ref 3.0–12.0)
Monocytes Absolute: 0.6 10*3/uL (ref 0.1–1.0)
NEUTROS ABS: 3.7 10*3/uL (ref 1.4–7.7)
Neutrophils Relative %: 59.6 % (ref 43.0–77.0)
Platelets: 298 10*3/uL (ref 150.0–400.0)
RBC: 4.62 Mil/uL (ref 3.87–5.11)
RDW: 14.8 % (ref 11.5–15.5)
WBC: 6.3 10*3/uL (ref 4.0–10.5)

## 2015-05-13 LAB — POCT URINALYSIS DIPSTICK
Bilirubin, UA: NEGATIVE
Glucose, UA: NEGATIVE
Ketones, UA: NEGATIVE
LEUKOCYTES UA: NEGATIVE
Nitrite, UA: NEGATIVE
PH UA: 5
SPEC GRAV UA: 1.025
Urobilinogen, UA: 0.2

## 2015-05-13 LAB — TSH: TSH: 1.9 u[IU]/mL (ref 0.35–4.50)

## 2015-05-13 LAB — COMPREHENSIVE METABOLIC PANEL
ALT: 15 U/L (ref 0–35)
AST: 17 U/L (ref 0–37)
Albumin: 4.3 g/dL (ref 3.5–5.2)
Alkaline Phosphatase: 45 U/L (ref 39–117)
BUN: 12 mg/dL (ref 6–23)
CALCIUM: 10.1 mg/dL (ref 8.4–10.5)
CHLORIDE: 101 meq/L (ref 96–112)
CO2: 28 meq/L (ref 19–32)
CREATININE: 0.79 mg/dL (ref 0.40–1.20)
GFR: 108.88 mL/min (ref >60.00)
Glucose, Bld: 100 mg/dL — ABNORMAL HIGH (ref 70–99)
Potassium: 3.9 mEq/L (ref 3.5–5.1)
Sodium: 136 mEq/L (ref 135–145)
TOTAL PROTEIN: 7.9 g/dL (ref 6.0–8.3)
Total Bilirubin: 0.5 mg/dL (ref 0.2–1.2)

## 2015-05-13 LAB — HEMOGLOBIN A1C: HEMOGLOBIN A1C: 6 % (ref 4.6–6.5)

## 2015-05-13 MED ORDER — TRETINOIN 0.025 % EX CREA: TOPICAL_CREAM | Freq: Every day | CUTANEOUS | Status: AC

## 2015-05-13 MED ORDER — CHLORTHALIDONE 25 MG PO TABS: 25.0000 mg | ORAL_TABLET | Freq: Every day | ORAL | Status: DC

## 2015-05-13 MED ORDER — THYROID 30 MG PO TABS: ORAL_TABLET | ORAL | Status: DC

## 2015-05-13 MED ORDER — POTASSIUM CHLORIDE ER 10 MEQ PO TBCR: 10.0000 meq | EXTENDED_RELEASE_TABLET | Freq: Every day | ORAL | Status: DC

## 2015-05-13 NOTE — Progress Notes (Signed)
Subjective:    Patient ID: Kaitlyn Cox, female    DOB: 10-18-1981, 34 y.o.   MRN: [id number]  HPI 34 year old female, nonsmoker with a history of hypothyroidism, hypertension, and hyperglycemia is in today for complete physical exam. Denies any concerns today. Had a Pap smear done last year that was normal. She is not currently sexually active. No concerns of sexually transmitted diseases. Has not been exercising.   Review of Systems  Constitutional: Negative.   HENT: Negative.   Eyes: Negative.   Respiratory: Negative.   Cardiovascular: Negative.   Gastrointestinal: Negative.   Endocrine: Negative.   Genitourinary: Negative.   Musculoskeletal: Negative.   Skin: Negative.   Allergic/Immunologic: Negative.   Neurological: Negative.   Hematological: Negative.   Psychiatric/Behavioral: Negative.    Past Medical History  Diagnosis Date  . Thyroid disease     hypothyroid  . Hypertension     History   Social History  . Marital Status: Single    Spouse Name: N/A  . Number of Children: N/A  . Years of Education: N/A   Occupational History  . Not on file.   Social History Main Topics  . Smoking status: Never Smoker   . Smokeless tobacco: Never Used  . Alcohol Use: No  . Drug Use: No  . Sexual Activity:    Partners: Male    Birth Control/ Protection: Abstinence   Other Topics Concern  . Not on file   Social History Narrative    Past Surgical History  Procedure Laterality Date  . Myomectomy  2011  . Uterine fibroid surgery  5/14    Family History  Problem Relation Age of Onset  . Diabetes Mother   . Anxiety disorder Mother   . Hypertension Mother   . Anxiety disorder Father   . Hypertension Father   . Diabetes Maternal Grandmother   . Breast cancer Maternal Grandmother   . Lung cancer Paternal Grandfather     lung    No Known Allergies  Current Outpatient Prescriptions on File Prior to Visit  Medication Sig Dispense Refill  . IBUPROFEN PO Take  by mouth as needed.    . Multiple Vitamins-Minerals (MULTIVITAMIN PO) Take by mouth daily.     No current facility-administered medications on file prior to visit.    BP 118/60 mmHg  Pulse 90  Temp(Src) 98.5 F (36.9 C) (Oral)  Ht 5\' 8"  (1.727 m)  Wt 236 lb 1.6 oz (107.094 kg)  BMI 35.91 kg/m2chart    Objective:   Physical Exam  Constitutional: She is oriented to person, place, and time. She appears well-developed and well-nourished.  HENT:  Head: Normocephalic.  Right Ear: External ear normal.  Left Ear: External ear normal.  Nose: Nose normal.  Mouth/Throat: Oropharynx is clear and moist.  Eyes: Conjunctivae and EOM are normal. Pupils are equal, round, and reactive to light.  Neck: Normal range of motion. Neck supple. No thyromegaly present.  Cardiovascular: Normal rate, regular rhythm and normal heart sounds.   Pulmonary/Chest: Effort normal and breath sounds normal.  Abdominal: Soft. Bowel sounds are normal.  Musculoskeletal: Normal range of motion. She exhibits no edema or tenderness.  Neurological: She is alert and oriented to person, place, and time. She has normal reflexes. She displays normal reflexes. No cranial nerve deficit. Coordination normal.  Skin: Skin is warm and dry.  Psychiatric: She has a normal mood and affect.          Assessment & Plan:  Kaitlyn Cox was  seen today for annual exam.  Diagnoses and all orders for this visit:  Preventative health care Orders: -     Lipid Panel -     CBC with Differential -     POC Urinalysis Dipstick -     CMP -     TSH -     HIV antibody (with reflex)  Hypothyroidism, unspecified hypothyroidism type Orders: -     Lipid Panel -     CBC with Differential -     POC Urinalysis Dipstick -     CMP -     TSH -     HIV antibody (with reflex)  Hyperglycemia Orders: -     Lipid Panel -     CBC with Differential -     POC Urinalysis Dipstick -     CMP -     TSH -     HIV antibody (with reflex) -     Hemoglobin  A1c  Overweight Orders: -     Lipid Panel -     CBC with Differential -     POC Urinalysis Dipstick -     CMP -     TSH -     HIV antibody (with reflex)  Acne vulgaris  Other orders -     chlorthalidone (HYGROTON) 25 MG tablet; Take 1 tablet (25 mg total) by mouth daily. -     thyroid (ARMOUR THYROID) 30 MG tablet; TAKE 1 TABLET BY MOUTH EVERY DAY -     tretinoin (RETIN-A) 0.025 % cream; Apply topically at bedtime. -     potassium chloride (K-DUR) 10 MEQ tablet; Take 1 tablet (10 mEq total) by mouth daily.   Encouraged healthy diet and exercise, self breast exams. Call the office with any questions or concerns. Follow-up in 6 months and sooner as needed.

## 2015-05-13 NOTE — Patient Instructions (Signed)
Exercise to Stay Healthy Exercise helps you become and stay healthy. EXERCISE IDEAS AND TIPS Choose exercises that:  You enjoy.  Fit into your day. You do not need to exercise really hard to be healthy. You can do exercises at a slow or medium level and stay healthy. You can:  Stretch before and after working out.  Try yoga, Pilates, or tai chi.  Lift weights.  Walk fast, swim, jog, run, climb stairs, bicycle, dance, or rollerskate.  Take aerobic classes. Exercises that burn about 150 calories:  Running 1  miles in 15 minutes.  Playing volleyball for 45 to 60 minutes.  Washing and waxing a car for 45 to 60 minutes.  Playing touch football for 45 minutes.  Walking 1  miles in 35 minutes.  Pushing a stroller 1  miles in 30 minutes.  Playing basketball for 30 minutes.  Raking leaves for 30 minutes.  Bicycling 5 miles in 30 minutes.  Walking 2 miles in 30 minutes.  Dancing for 30 minutes.  Shoveling snow for 15 minutes.  Swimming laps for 20 minutes.  Walking up stairs for 15 minutes.  Bicycling 4 miles in 15 minutes.  Gardening for 30 to 45 minutes.  Jumping rope for 15 minutes.  Washing windows or floors for 45 to 60 minutes. Document Released: 01/12/2011 Document Revised: 03/03/2012 Document Reviewed: 01/12/2011 Exit[employer] Patient Information 2015 [employer]. This information is not intended to replace advice given to you by your health care provider. Make sure you discuss any questions you have with your health care provider.  

## 2015-05-14 LAB — HIV ANTIBODY (ROUTINE TESTING W REFLEX): HIV 1&2 Ab, 4th Generation: NONREACTIVE

## 2015-06-07 ENCOUNTER — Telehealth: Payer: Self-pay | Admitting: Family

## 2015-06-07 DIAGNOSIS — R739 Hyperglycemia, unspecified: Secondary | ICD-10-CM

## 2015-06-07 NOTE — Telephone Encounter (Addendum)
Pt cannot afford the [facility] day [facility]  would like to know if you can send 30 days to local [facility]  thyroid (ARMOUR THYROID) 30 MG tablet potassium chloride (K-DUR) 10 MEQ tablet chlorthalidone (HYGROTON) 25 MG tablet  [facility] but as mentioned , pt doesn't have the $.   ALSO, pt would like referral to a nurtrionlost. Pt is pre-diabetic, and insulin resistant and would like some asssticance w/ her diet.

## 2015-06-08 MED ORDER — POTASSIUM CHLORIDE ER 10 MEQ PO TBCR: 10.0000 meq | EXTENDED_RELEASE_TABLET | Freq: Every day | ORAL | Status: DC

## 2015-06-08 MED ORDER — THYROID 30 MG PO TABS: ORAL_TABLET | ORAL | Status: DC

## 2015-06-08 MED ORDER — CHLORTHALIDONE 25 MG PO TABS
25.0000 mg | ORAL_TABLET | Freq: Every day | ORAL | Status: DC
Start: 2015-06-08 — End: 2016-04-11

## 2015-06-08 NOTE — Telephone Encounter (Signed)
Scripts sent to pharmacy and referral placed

## 2015-07-25 ENCOUNTER — Encounter: Payer: Self-pay | Admitting: *Deleted

## 2015-07-25 ENCOUNTER — Encounter: Payer: Commercial Managed Care - PPO | Attending: Family | Admitting: *Deleted

## 2015-07-25 DIAGNOSIS — Z713 Dietary counseling and surveillance: Secondary | ICD-10-CM | POA: Insufficient documentation

## 2015-07-25 DIAGNOSIS — R739 Hyperglycemia, unspecified: Secondary | ICD-10-CM | POA: Insufficient documentation

## 2015-07-25 NOTE — Progress Notes (Signed)
  Medical Nutrition Therapy:  Appt start time: 0086 end time:  1630.   Assessment:  Primary concerns today: Kaitlyn Cox is here for nutrition counseling pertaining to prediabetes.  A1c is 6.0%. Patient reports that diabetes runs high in her family.  She would also like to lose weight.  She has attempted to lose weight in the past through exercising 3-4 times/week and she tried to eat "healthier" but she isn't sure what she should be doing.  Reports her doctor made a dietary recommendations (low carb), but that was not sustainable.   Heaviest weight 245 lb, lowest down to 215 after surgery.  Her normal weight is 220-235 lb.  Mom is petite, but dad is tall.  Female relatives are slim, but grandmom is heavier.    Periods are regular.  Complains of acne. Has never been tested for PCOS.  She lives by herself and does her own grocery shopping and cooking.  She eats out 3-4 times/week: fast food.  When at home she eats in the living room while watching tv.  She thinks she is a fast eater.     Preferred Learning Style:  No preference indicated   Learning Readiness:   Ready   MEDICATIONS: see list   DIETARY INTAKE:  Usual eating pattern includes 2-3 meals and not many snacks per day.  Everyday foods include energy-dense refined processed foods.  Avoided foods include seafood.    24-hr recall:  B ( AM): sometimes skips. Today had 2 boiled eggs and banana  Snk ( AM): not usually  L ( PM): 10 mcnuggets with medium fries. And medium sprite Snk ( PM): maybe chips or candy D ( PM): sandwich or pizza Snk ( PM): ice cream or popcorn Beverages: water or flavored water "has terrible sweet tooth"  Also likes bread Usual physical activity: none currently  Estimated energy needs: 2000 calories 225 g carbohydrates 150 g protein 56 g fat  Progress Towards Goal(s):  In progress.   Nutritional Diagnosis:  [place]-2.1 Inpaired nutrition utilization As related to carbohydrates.  As evidenced by A1c 6.0%.     Intervention:  Nutrition counseling provided.  Discussed mindful eating practices: eating without distractions (no tv, work, phone, etc); eating more slowly and aim for meals to last 20 minutes.  Stop eating when comfortable, not stuffed.   Discussed HAES principles and encouraged focusing on her health, not her weight.  Discussed recommendations for physical activity to improve glucose, lipids, and blood pressure parameters.  Discussed MyPlate for diabetes: decrease total carbohydrate and increase whole grains; lean proteins; non-starchy vegetables.  Limit sugary beverages  Teaching Method Utilized: Visual Auditory   Handouts given during visit include:  Low carb snacks  myplate for diabetes  Meal plan card  Barriers to learning/adherence to lifestyle change: none  Demonstrated degree of understanding via:  Teach Back   Monitoring/Evaluation:  Dietary intake, exercise, labs, and body weight prn.

## 2015-11-24 ENCOUNTER — Ambulatory Visit (INDEPENDENT_AMBULATORY_CARE_PROVIDER_SITE_OTHER): Payer: Commercial Managed Care - PPO | Admitting: Family Medicine

## 2015-11-24 ENCOUNTER — Telehealth: Payer: Self-pay

## 2015-11-24 ENCOUNTER — Encounter: Payer: Self-pay | Admitting: Family Medicine

## 2015-11-24 VITALS — BP 100/72 | HR 76 | Temp 98.4°F | Ht 68.0 in | Wt 236.4 lb

## 2015-11-24 DIAGNOSIS — H6983 Other specified disorders of Eustachian tube, bilateral: Secondary | ICD-10-CM | POA: Diagnosis not present

## 2015-11-24 DIAGNOSIS — J069 Acute upper respiratory infection, unspecified: Secondary | ICD-10-CM

## 2015-11-24 NOTE — Progress Notes (Signed)
HPI:  Acute visit for:  Ear Pain: -started about 4-5 days ago -L ear pain, pressure, some sore throat/?drainage -denies: fevers, NVD, SOB, sinus pain, flu or strep exposure, drainage from ear, hearing loss -hx possible allergic rhinitis in the past  ROS: See pertinent positives and negatives per HPI.  Past Medical History  Diagnosis Date  . Thyroid disease     hypothyroid  . Hypertension     Past Surgical History  Procedure Laterality Date  . Myomectomy  2011  . Uterine fibroid surgery  5/14    Family History  Problem Relation Age of Onset  . Diabetes Mother   . Anxiety disorder Mother   . Hypertension Mother   . Anxiety disorder Father   . Hypertension Father   . Diabetes Maternal Grandmother   . Breast cancer Maternal Grandmother   . Lung cancer Paternal Grandfather     lung    Social History   Social History  . Marital Status: Single    Spouse Name: N/A  . Number of Children: N/A  . Years of Education: N/A   Social History Main Topics  . Smoking status: Never Smoker   . Smokeless tobacco: Never Used  . Alcohol Use: No  . Drug Use: No  . Sexual Activity:    Partners: Male    Birth Control/ Protection: Abstinence   Other Topics Concern  . None   Social History Narrative     Current outpatient prescriptions:  .  chlorthalidone (HYGROTON) 25 MG tablet, Take 1 tablet (25 mg total) by mouth daily., Disp: 90 tablet, Rfl: 1 .  IBUPROFEN PO, Take by mouth as needed., Disp: , Rfl:  .  Multiple Vitamins-Minerals (MULTIVITAMIN PO), Take by mouth daily., Disp: , Rfl:  .  norethindrone-ethinyl estradiol (CYCLAFEM,ALYACEN) 0.5/0.75/1-35 MG-MCG tablet, Take 1 tablet by mouth daily., Disp: , Rfl:  .  potassium chloride (K-DUR) 10 MEQ tablet, Take 1 tablet (10 mEq total) by mouth daily., Disp: 90 tablet, Rfl: 1 .  thyroid (ARMOUR THYROID) 30 MG tablet, TAKE 1 TABLET BY MOUTH EVERY DAY, Disp: 90 tablet, Rfl: 1 .  tretinoin (RETIN-A) 0.025 % cream, Apply  topically at bedtime., Disp: 45 g, Rfl: 1  EXAM:  Filed Vitals:   11/24/15 0832  BP: 100/72  Pulse: 76  Temp: 98.4 F (36.9 C)    Body mass index is 35.95 kg/(m^2).  GENERAL: vitals reviewed and listed above, alert, oriented, appears well hydrated and in no acute distress  HEENT: atraumatic, conjunttiva clear, no obvious abnormalities on inspection of external nose and ears, normal appearance of ear canals and TMs - clear effusion bilat with mild retraction L TM, clear nasal congestion, mild post oropharyngeal erythema with PND, no tonsillar edema or exudate, no sinus TTP  NECK: no obvious masses on inspection  LUNGS: clear to auscultation bilaterally, no wheezes, rales or rhonchi, good air movement  CV: HRRR, no peripheral edema  MS: moves all extremities without noticeable abnormality  PSYCH: pleasant and cooperative, no obvious depression or anxiety  ASSESSMENT AND PLAN:  Discussed the following assessment and plan:  Acute upper respiratory infection  Eustachian tube dysfunction, bilateral  -likely allergic or viral rhinosinusitis with ETD per hx and exam -advised per instructions  -Patient advised to return or notify a doctor immediately if symptoms worsen or persist or new concerns arise.  Patient Instructions  AFRIN nasal spray for 4 days then STOP  Flonase for 21 days - 2 sprays each nostril  Follow up if any  symptoms persist, worsen or new concerns     KIM, HANNAH R.

## 2015-11-24 NOTE — Patient Instructions (Signed)
AFRIN nasal spray for 4 days then STOP  Flonase for 21 days - 2 sprays each nostril  Follow up if any symptoms persist, worsen or new concerns

## 2015-11-24 NOTE — Progress Notes (Signed)
Pre visit review using our clinic review tool, if applicable. No additional management support is needed unless otherwise documented below in the visit note. 

## 2015-11-24 NOTE — Telephone Encounter (Signed)
[facility] - Client TELEPHONE [facility] Patient Name: Kaitlyn Cox Gender: Female DOB: [birth year] Age: 34 Y 11 M 8 D Return Phone Number: [phone] (Primary) Address: City/State/Zip: [place] Client [facility] - Client Client Site [facility] - Night Physician Roxy Cedar Contact Type Call Call Type Triage / Clinical Caller Name [place] Relationship To Patient Self Return Phone Number [phone] (Primary) Chief Complaint Earache Initial Comment Caller states she has an earache - going on about four days. PreDisposition Call Doctor Nurse Assessment Nurse: Robina Ade, RN, Sarah Date/Time Eilene Ghazi Time): 11/23/2015 5:23:07 PM Confirm and document reason for call. If symptomatic, describe symptoms. ---Caller states 4 days ago she started out with headache, earache, and swimmy head. Currently only has pain in L ear. Has the patient traveled out of the country within the last 30 days? ---No Does the patient have any new or worsening symptoms? ---Yes Will a triage be completed? ---Yes Related visit to physician within the last 2 weeks? ---No Does the PT have any chronic conditions? (i.e. diabetes, asthma, etc.) ---Yes List chronic conditions. ---HTN Did the patient indicate they were pregnant? ---No Is this a behavioral health call? ---No Guidelines Guideline Title Affirmed Question Affirmed Notes Nurse Date/Time (Eastern Time) Earache Earache (Exceptions: brief ear pain of < 60 minutes duration, earache occurring during air travel Wenda Over[place] 11/23/2015 5:29:42 PM Disp. Time Eilene Ghazi Time) Disposition Final User 11/23/2015 5:40:36 PM See Physician within 24 Hours Yes Robina Ade, RN, [place] Understands: Yes PLEASE NOTE: All timestamps contained within this report are represented as [place] Standard Time. CONFIDENTIALTY NOTICE: This fax transmission is intended  only for the addressee. It contains information that is legally privileged, confidential or otherwise protected from use or disclosure. If you are not the intended recipient, you are strictly prohibited from reviewing, disclosing, copying using or disseminating any of this information or taking any action in reliance on or regarding this information. If you have received this fax in error, please notify us immediately by telephone so that we can arrange for its return to [place]. Phone: [phone], Toll-Free: [phone], Fax: [phone] Page: 2 of 2 Call Id: [id number] Disagree/Comply: Comply Care Advice Given Per Guideline SEE PHYSICIAN WITHIN 24 HOURS: * IF OFFICE WILL BE OPEN: You need to be seen within the next 24 hours. Call your doctor when the office opens, and make an appointment. PAIN MEDICINES: * For pain relief, take acetaminophen, ibuprofen, or naproxen. LOCAL COLD: * Apply a cold pack or a cold wet washcloth to outer ear for 20 min. to reduce pain while medicine takes effect (Note: some adults prefer local heat for 20 minutes) * Severe pain persists over 2 hours after pain medicine CALL BACK IF: * You become worse. CARE ADVICE given per Earache (Adult) guideline. * Do not take nonsteroidal anti-inflammatory drugs (NSAIDs) if you have stomach problems, kidney disease, heart failure, or other contraindications to using this type of medication. CAUTION - NSAIDS (E.G., IBUPROFEN, NAPROXEN): After Care Instructions Given Call Event Type User Date / Time Description Comments User: Sela Hua, RN Date/Time Eilene Ghazi Time): 11/23/2015 5:31:23 PM Used OTC earache liquid for swimmers ear last night. Did not notice relief from this. User: Sela Hua, RN Date/Time Eilene Ghazi Time): 11/23/2015 5:40:10 PM Pt states she already has appointment with PCP in AM for earache. Referrals REFERRED TO PCP OFFICE   Pt has a pending appt today 11/24/2015.

## 2015-11-27 ENCOUNTER — Other Ambulatory Visit: Payer: Self-pay | Admitting: Family

## 2015-12-08 ENCOUNTER — Ambulatory Visit: Payer: Commercial Managed Care - PPO | Admitting: Adult Health

## 2016-04-06 ENCOUNTER — Other Ambulatory Visit: Payer: Self-pay | Admitting: Family

## 2016-04-09 NOTE — Telephone Encounter (Signed)
I have never seen her. She needs to establish with someone new

## 2016-04-09 NOTE — Telephone Encounter (Signed)
Pt will see Dr Martinique. Will get rx then.

## 2016-04-09 NOTE — Telephone Encounter (Signed)
Please advise 

## 2016-04-11 ENCOUNTER — Ambulatory Visit (INDEPENDENT_AMBULATORY_CARE_PROVIDER_SITE_OTHER): Payer: Commercial Managed Care - PPO | Admitting: Family Medicine

## 2016-04-11 ENCOUNTER — Encounter: Payer: Self-pay | Admitting: Family Medicine

## 2016-04-11 VITALS — BP 130/84 | HR 85 | Temp 97.4°F | Wt 237.5 lb

## 2016-04-11 DIAGNOSIS — E038 Other specified hypothyroidism: Secondary | ICD-10-CM

## 2016-04-11 DIAGNOSIS — Z6836 Body mass index (BMI) 36.0-36.9, adult: Secondary | ICD-10-CM | POA: Diagnosis not present

## 2016-04-11 DIAGNOSIS — E785 Hyperlipidemia, unspecified: Secondary | ICD-10-CM

## 2016-04-11 DIAGNOSIS — E876 Hypokalemia: Secondary | ICD-10-CM | POA: Diagnosis not present

## 2016-04-11 DIAGNOSIS — I10 Essential (primary) hypertension: Secondary | ICD-10-CM

## 2016-04-11 MED ORDER — CHLORTHALIDONE 25 MG PO TABS: 25.0000 mg | ORAL_TABLET | Freq: Every day | ORAL | Status: DC

## 2016-04-11 MED ORDER — THYROID 30 MG PO TABS: ORAL_TABLET | ORAL | Status: DC

## 2016-04-11 MED ORDER — POTASSIUM CHLORIDE CRYS ER 10 MEQ PO TBCR: 10.0000 meq | EXTENDED_RELEASE_TABLET | Freq: Every day | ORAL | Status: DC

## 2016-04-11 NOTE — Progress Notes (Addendum)
Subjective:    Patient ID: Kaitlyn Cox, female    DOB: 1981/11/13, 35 y.o.   MRN: [id number]  HPI  [name] is a 35 y.o.female here today to follow on HTN and other chronic medical problems.  Dx with HTN in 04/2013. Both parents with HTN and HLD, father DM II. Currently she is on Chlorthalidone 25 mg daily.   Problem has been stable.  Exercising some and not following a healthy except for low salt diet. Home BP's not checked.  She has not noted unusual headache, visual changes, exertional chest pain, dyspnea, focal weakness, or worsening edema.LE edema, intermittent, and worse at the end of the day, chronic.  Tolerating medications well with no major side effects reported.  HypoK+ on KCL 10 meq daily.   Lab Results  Component Value Date   CREATININE 0.79 05/13/2015   BUN 12 05/13/2015   NA 136 05/13/2015   K 3.9 05/13/2015   CL 101 05/13/2015   CO2 28 05/13/2015    HLD: Currently she is on no pharmacologic treatment. She started exercising about a week ago, she is not following a healthy diet consistently.   Lab Results  Component Value Date   CHOL 236* 05/13/2015   HDL 36.80* 05/13/2015   LDLCALC 163* 05/13/2015   TRIG 179.0* 05/13/2015   CHOLHDL 6 05/13/2015   Hypothyroidism:  Currently she is on Harmour Thyroid 30 mg daily, which she has taken for about a year or so. . Tolerating medication well, no side effects reported. She has not noted dysphagia, palpitations, abdominal pain, changes in bowel habits, tremor, cold/heat intolerance, or abnormal weight loss.  Lab Results  Component Value Date   TSH 1.90 05/13/2015     She has Hx of uterine fibroid and endometriosis, currently she follows with gyn and on OCP's.   Review of Systems  Constitutional: Negative for fever, activity change, appetite change, fatigue and unexpected weight change.  HENT: Negative for mouth sores, nosebleeds and trouble swallowing.   Eyes: Negative for redness and  visual disturbance.  Respiratory: Negative for apnea, cough, shortness of breath and wheezing.   Cardiovascular: Negative for chest pain, palpitations and leg swelling.  Gastrointestinal: Negative for nausea, vomiting and abdominal pain.       Negative for changes in bowel habits.  Genitourinary: Negative for dysuria, hematuria, decreased urine volume and difficulty urinating.       Currently on continuous OCP's. Denies sexual activity.  Musculoskeletal: Negative for myalgias and back pain.  Neurological: Negative for seizures, syncope, weakness, numbness and headaches.  Psychiatric/Behavioral: Negative for confusion. The patient is not nervous/anxious.      Current Outpatient Prescriptions on File Prior to Visit  Medication Sig Dispense Refill  . IBUPROFEN PO Take by mouth as needed.    . Multiple Vitamins-Minerals (MULTIVITAMIN PO) Take by mouth daily.    . norethindrone-ethinyl estradiol (CYCLAFEM,ALYACEN) 0.5/0.75/1-35 MG-MCG tablet Take 1 tablet by mouth daily.    . potassium chloride (K-DUR) 10 MEQ tablet Take 1 tablet (10 mEq total) by mouth daily. 90 tablet 1  . tretinoin (RETIN-A) 0.025 % cream Apply topically at bedtime. 45 g 1   No current facility-administered medications on file prior to visit.     Past Medical History  Diagnosis Date  . Thyroid disease     hypothyroid  . Hypertension     Social History   Social History  . Marital Status: Single    Spouse Name: N/A  . Number of Children: N/A  .  Years of Education: N/A   Social History Main Topics  . Smoking status: Never Smoker   . Smokeless tobacco: Never Used  . Alcohol Use: No  . Drug Use: No  . Sexual Activity:    Partners: Male    Birth Control/ Protection: Abstinence   Other Topics Concern  . None   Social History Narrative    Filed Vitals:   04/11/16 0814  BP: 130/84  Pulse: 85  Temp: 97.4 F (36.3 C)   Body mass index is 36.12 kg/(m^2).      Objective:   Physical Exam    Constitutional: She is oriented to person, place, and time. She appears well-developed. She is cooperative. No distress.  HENT:  Head: Normocephalic and atraumatic.  Mouth/Throat: Oropharynx is clear and moist.  Eyes: Conjunctivae and EOM are normal. Pupils are equal, round, and reactive to light.  Neck: No thyromegaly present.  Cardiovascular: Normal rate, regular rhythm and normal heart sounds.   No murmur heard. Pulses:      Dorsalis pedis pulses are 2+ on the right side, and 2+ on the left side.  Pulmonary/Chest: Effort normal and breath sounds normal. She has no wheezes. She has no rales. She exhibits no tenderness.  Abdominal: Soft. She exhibits mass (from suprapubic area to under umbilicus fundal high, fibroid. Scar from prior myomectomy. ). There is no tenderness.    Musculoskeletal: She exhibits edema (mild bilateral pedal edema, non pitting). She exhibits no tenderness.  Lymphadenopathy:    She has no cervical adenopathy.  Neurological: She is alert and oriented to person, place, and time. Coordination and gait normal.  Skin: Skin is warm. No erythema.  Psychiatric: She has a normal mood and affect.  Well groomed, good eye contact.  Nursing note and vitals reviewed.      Assessment & Plan:   Kaitlyn Cox was seen today for medication refill.  Diagnoses and all orders for this visit:  Essential hypertension  Adequately controlled. No changes in current management. Healthy low salt diet recommended, portions control. Eye exam recommended annually. F/U in 6 months.  -     chlorthalidone (HYGROTON) 25 MG tablet; Take 1 tablet (25 mg total) by mouth daily. -     COMPLETE METABOLIC PANEL WITH GFR; Future  Other specified hypothyroidism  No changes in current management, will plan on labs next OV and will give further recommendations accordingly.  -     thyroid (ARMOUR THYROID) 30 MG tablet; TAKE 1 TABLET BY MOUTH EVERY DAY -     TSH; Future -     T3, free; Future -      T4, free; Future  Hypokalemia Most likely related to diuretic. No changes for now. She would like labs done with her physical.  -     potassium chloride (KLOR-CON M10) 10 MEQ tablet; Take 1 tablet (10 mEq total) by mouth daily. -     COMPLETE METABOLIC PANEL WITH GFR; Future  BMI 36.0-36.9,adult We discussed benefits of wt loss as well as adverse effects of obesity. Consistency with healthy diet and physical activity recommended. Weight Watchers is a good option as well as daily brisk walking for 15-30 min as tolerated.  Hyperlipidemia  Recommended starting low fat diet and regular exercise. Will have FLP before next OV.  -     Lipid panel; Future -     COMPLETE METABOLIC PANEL WITH GFR; Future   She would like labs to be done with her physical and no today. Future labs  placed to be done 2-3 days before next OV. She will continue following with her gyn for her female preventive care and fibroids.  Betty G. Martinique, MD  [facility]. [place] office.

## 2016-04-11 NOTE — Patient Instructions (Signed)
A few things to remember from today's visit:   1. Essential hypertension  - chlorthalidone (HYGROTON) 25 MG tablet; Take 1 tablet (25 mg total) by mouth daily.  Dispense: 90 tablet; Refill: 1  2. Other specified hypothyroidism  - thyroid (ARMOUR THYROID) 30 MG tablet; TAKE 1 TABLET BY MOUTH EVERY DAY  Dispense: 90 tablet; Refill: 1  3. Hypokalemia  - potassium chloride (KLOR-CON M10) 10 MEQ tablet; Take 1 tablet (10 mEq total) by mouth daily.  Dispense: 90 tablet; Refill: 0  4. BMI 36.0-36.9,adult   Minimum weekly activity to achieve important health benefits: Two hours and 30 minutes (150 minutes) of moderate-intensity* aerobic activity (e.g., brisk walking), plus muscle-strengthening activities for at least two days or One hour and 15 minutes (75 minutes) of vigorous-intensity? aerobic activity (e.g., jogging, running), plus muscle-strengthening activities for at least two days or A combination of moderate- and vigorous-intensity aerobic activities equivalent to the recommendations above, plus muscle-strengthening activities for at least two days Flexibility exercises (static movements) should be performed at least two days per week at moderate intensity, preferably after aerobic or resistance exercises.16 Older adults at risk of falling should do balance training16 for three or more days per week.13      If you sign-up for My chart, you can communicate easier with [place] in case you have any question or concern.

## 2016-04-11 NOTE — Progress Notes (Signed)
Pre visit review using our clinic review tool, if applicable. No additional management support is needed unless otherwise documented below in the visit note. 

## 2016-05-15 ENCOUNTER — Other Ambulatory Visit (INDEPENDENT_AMBULATORY_CARE_PROVIDER_SITE_OTHER): Payer: Commercial Managed Care - PPO

## 2016-05-15 DIAGNOSIS — I10 Essential (primary) hypertension: Secondary | ICD-10-CM

## 2016-05-15 DIAGNOSIS — Z0000 Encounter for general adult medical examination without abnormal findings: Secondary | ICD-10-CM | POA: Diagnosis not present

## 2016-05-15 LAB — BASIC METABOLIC PANEL
BUN: 9 mg/dL (ref 6–23)
CALCIUM: 9.5 mg/dL (ref 8.4–10.5)
CO2: 25 meq/L (ref 19–32)
CREATININE: 0.78 mg/dL (ref 0.40–1.20)
Chloride: 103 mEq/L (ref 96–112)
GFR: 109.78 mL/min (ref >60.00)
GLUCOSE: 118 mg/dL — AB (ref 70–99)
POTASSIUM: 3.1 meq/L — AB (ref 3.5–5.1)
SODIUM: 137 meq/L (ref 135–145)

## 2016-05-15 LAB — HEPATIC FUNCTION PANEL
ALK PHOS: 31 U/L — AB (ref 39–117)
ALT: 11 U/L (ref 0–35)
AST: 13 U/L (ref 0–37)
Albumin: 4.1 g/dL (ref 3.5–5.2)
BILIRUBIN DIRECT: 0.1 mg/dL (ref 0.0–0.3)
Total Bilirubin: 0.3 mg/dL (ref 0.2–1.2)
Total Protein: 7.3 g/dL (ref 6.0–8.3)

## 2016-05-15 LAB — CBC WITH DIFFERENTIAL/PLATELET
BASOS ABS: 0.2 10*3/uL — AB (ref 0.0–0.1)
Basophils Relative: 3.2 % — ABNORMAL HIGH (ref 0.0–3.0)
Eosinophils Absolute: 0.5 10*3/uL (ref 0.0–0.7)
Eosinophils Relative: 8 % — ABNORMAL HIGH (ref 0.0–5.0)
HCT: 36.7 % (ref 36.0–46.0)
Hemoglobin: 12.3 g/dL (ref 12.0–15.0)
LYMPHS ABS: 2.4 10*3/uL (ref 0.7–4.0)
LYMPHS PCT: 37.7 % (ref 12.0–46.0)
MCHC: 33.6 g/dL (ref 30.0–36.0)
MCV: 83 fl (ref 78.0–100.0)
Monocytes Absolute: 0.6 10*3/uL (ref 0.1–1.0)
Monocytes Relative: 8.7 % (ref 3.0–12.0)
NEUTROS ABS: 2.7 10*3/uL (ref 1.4–7.7)
NEUTROS PCT: 42.4 % — AB (ref 43.0–77.0)
PLATELETS: 298 10*3/uL (ref 150.0–400.0)
RBC: 4.42 Mil/uL (ref 3.87–5.11)
RDW: 14.6 % (ref 11.5–15.5)
WBC: 6.4 10*3/uL (ref 4.0–10.5)

## 2016-05-15 LAB — POC URINALSYSI DIPSTICK (AUTOMATED)
GLUCOSE UA: NEGATIVE
LEUKOCYTES UA: NEGATIVE
NITRITE UA: NEGATIVE
Spec Grav, UA: >=1.03
UROBILINOGEN UA: 1
pH, UA: 5.5

## 2016-05-15 LAB — LIPID PANEL
CHOLESTEROL: 262 mg/dL — AB (ref 0–200)
HDL: 31.7 mg/dL — ABNORMAL LOW (ref >39.00)
LDL CALC: 192 mg/dL — AB (ref 0–99)
NonHDL: 230.04
TRIGLYCERIDES: 188 mg/dL — AB (ref 0.0–149.0)
Total CHOL/HDL Ratio: 8
VLDL: 37.6 mg/dL (ref 0.0–40.0)

## 2016-05-15 LAB — TSH: TSH: 3.57 u[IU]/mL (ref 0.35–4.50)

## 2016-05-22 ENCOUNTER — Encounter: Payer: Self-pay | Admitting: Family Medicine

## 2016-05-22 ENCOUNTER — Ambulatory Visit (INDEPENDENT_AMBULATORY_CARE_PROVIDER_SITE_OTHER): Payer: Commercial Managed Care - PPO | Admitting: Family Medicine

## 2016-05-22 VITALS — BP 118/74 | HR 70 | Temp 98.5°F | Resp 12 | Ht 68.0 in | Wt 234.3 lb

## 2016-05-22 DIAGNOSIS — Z124 Encounter for screening for malignant neoplasm of cervix: Secondary | ICD-10-CM | POA: Diagnosis not present

## 2016-05-22 DIAGNOSIS — R739 Hyperglycemia, unspecified: Secondary | ICD-10-CM

## 2016-05-22 DIAGNOSIS — E876 Hypokalemia: Secondary | ICD-10-CM

## 2016-05-22 DIAGNOSIS — Z0000 Encounter for general adult medical examination without abnormal findings: Secondary | ICD-10-CM | POA: Diagnosis not present

## 2016-05-22 DIAGNOSIS — R809 Proteinuria, unspecified: Secondary | ICD-10-CM

## 2016-05-22 DIAGNOSIS — I10 Essential (primary) hypertension: Secondary | ICD-10-CM

## 2016-05-22 LAB — MICROALBUMIN / CREATININE URINE RATIO
Creatinine,U: 329.2 mg/dL
MICROALB UR: 44.7 mg/dL — AB (ref 0.0–1.9)
Microalb Creat Ratio: 13.6 mg/g (ref 0.0–30.0)

## 2016-05-22 NOTE — Progress Notes (Signed)
Pre visit review using our clinic review tool, if applicable. No additional management support is needed unless otherwise documented below in the visit note. 

## 2016-05-22 NOTE — Progress Notes (Signed)
Subjective:    Patient ID: Kaitlyn Cox, female    DOB: October 27, 1981, 35 y.o.   MRN: [id number]  HPI   [name] is a 35 y.o. female, who is here today for her routine physical.  She does exercise regularly , 1-2 times per week; she does not follow a healthy diet.  She has hx of HTN, HLD, hypothyroidism among some.  Pap smear reported in 2015, negative.She denies any Hx of abnormal pap smear or STD's, she would like her pap smear done today. Currently she is not sexually active. Hx of fibroids, has had some removed and on continues OCP's. She follows with gyn, last OV reported by pt, 07/2015.  G:0 FHx for gynecologic cancer: MGM breast.  FHx of colon cancer: negative.  She has no concerns today: recent labs. Urine dip with 3+ protein, she denies any foam in urine, edema, or gross hematuria; ? Contamination, no vaginal bleeding or discharge that she can recall at the time of collection of sample. FG 118, no Hx of DM II, positive FHx.  Hx of HypoK+  She is on KCL 10 meq daily.  Currently she is on Chlorthalidone 25 mg daily for HTN. She taking medications as instructed, no side effects reported.  She has not noted unusual headache, visual changes, exertional chest pain, dyspnea,  focal weakness, or edema.  Lab Results  Component Value Date   CREATININE 0.78 05/15/2016   BUN 9 05/15/2016   NA 137 05/15/2016   K 3.1* 05/15/2016   CL 103 05/15/2016   CO2 25 05/15/2016     Lab Results  Component Value Date   HGBA1C 6.0 05/13/2015     Lipid Panel     Component Value Date/Time   CHOL 262* 05/15/2016 0902   TRIG 188.0* 05/15/2016 0902   HDL 31.70* 05/15/2016 0902   CHOLHDL 8 05/15/2016 0902   VLDL 37.6 05/15/2016 0902   LDLCALC 192* 05/15/2016 0902    Urinalysis    Component Value Date/Time   BILIRUBINUR 1+ 05/15/2016 0940   PROTEINUR 3+ 05/15/2016 0940   UROBILINOGEN 1.0 05/15/2016 0940   NITRITE n 05/15/2016 0940   LEUKOCYTESUR Negative 05/15/2016  0940     Lab Results  Component Value Date   WBC 6.4 05/15/2016   HGB 12.3 05/15/2016   HCT 36.7 05/15/2016   MCV 83.0 05/15/2016   PLT 298.0 05/15/2016     Review of Systems  Constitutional: Negative for fever, appetite change, fatigue and unexpected weight change.  HENT: Negative for dental problem, hearing loss, nosebleeds, trouble swallowing and voice change.   Eyes: Negative for photophobia and visual disturbance.  Respiratory: Negative for cough, shortness of breath and wheezing.   Cardiovascular: Negative for chest pain and leg swelling.  Gastrointestinal: Negative for nausea, vomiting, abdominal pain and blood in stool.       No changes in bowel habits.  Endocrine: Negative for cold intolerance, heat intolerance, polydipsia, polyphagia and polyuria.  Genitourinary: Negative for dysuria, hematuria, decreased urine volume, vaginal bleeding, vaginal discharge, genital sores, vaginal pain and menstrual problem (On continues OCP.).       No breast tenderness or nipple discharge.   Musculoskeletal: Negative for myalgias, back pain and arthralgias.  Skin: Negative for color change and rash.  Neurological: Negative for dizziness, syncope, weakness, numbness and headaches.  Hematological: Negative for adenopathy. Does not bruise/bleed easily.  Psychiatric/Behavioral: Negative for confusion and sleep disturbance. The patient is not nervous/anxious.   All other systems reviewed and are  negative.     Current Outpatient Prescriptions on File Prior to Visit  Medication Sig Dispense Refill  . IBUPROFEN PO Take by mouth as needed.    . Multiple Vitamins-Minerals (MULTIVITAMIN PO) Take by mouth daily.    . norethindrone-ethinyl estradiol (CYCLAFEM,ALYACEN) 0.5/0.75/1-35 MG-MCG tablet Take 1 tablet by mouth daily.    Marland Kitchen thyroid (ARMOUR THYROID) 30 MG tablet TAKE 1 TABLET BY MOUTH EVERY DAY 90 tablet 1  . tretinoin (RETIN-A) 0.025 % cream Apply topically at bedtime. 45 g 1   No  current facility-administered medications on file prior to visit.     Past Medical History  Diagnosis Date  . Thyroid disease     hypothyroid  . Hypertension    Family History  Problem Relation Age of Onset  . Diabetes Mother   . Anxiety disorder Mother   . Hypertension Mother   . Anxiety disorder Father   . Hypertension Father   . Diabetes Maternal Grandmother   . Breast cancer Maternal Grandmother   . Lung cancer Paternal Grandfather     lung     Social History   Social History  . Marital Status: Single    Spouse Name: N/A  . Number of Children: N/A  . Years of Education: N/A   Social History Main Topics  . Smoking status: Never Smoker   . Smokeless tobacco: Never Used  . Alcohol Use: No  . Drug Use: No  . Sexual Activity:    Partners: Male    Birth Control/ Protection: Abstinence   Other Topics Concern  . None   Social History Narrative    Filed Vitals:   05/22/16 1017  BP: 118/74  Pulse: 70  Temp: 98.5 F (36.9 C)  Resp: 12   Body mass index is 35.63 kg/(m^2).  SpO2 Readings from Last 3 Encounters:  05/22/16 98%  05/14/14 98%  05/11/14 98%        Objective:   Physical Exam  Constitutional: She is oriented to person, place, and time. She appears well-developed. No distress.  HENT:  Right Ear: Hearing, tympanic membrane and external ear normal.  Left Ear: Hearing, tympanic membrane, external ear and ear canal normal.  Mouth/Throat: Uvula is midline, oropharynx is clear and moist and mucous membranes are normal. No oral lesions.  No major deformity appreciated.  Eyes: Conjunctivae and EOM are normal. Pupils are equal, round, and reactive to light. Right eye exhibits no discharge. Left eye exhibits no discharge.  Neck: Normal range of motion. No thyroid mass and no thyromegaly present.  Cardiovascular: Normal rate, regular rhythm, normal heart sounds and normal pulses.   No murmur heard. Pulses:      Radial pulses are 2+ on the right side,  and 2+ on the left side.       Dorsalis pedis pulses are 2+ on the right side, and 2+ on the left side.  Pulmonary/Chest: Effort normal and breath sounds normal.  Abdominal: Soft. She exhibits mass (suprapubic right under umbilicus, fibroid.). There is no hepatosplenomegaly. There is no tenderness.  Genitourinary: Vagina normal. No breast swelling, tenderness or discharge. Pelvic exam was performed with patient supine. There is no lesion on the right labia. There is no lesion on the left labia. Uterus is enlarged. Uterus is not tender. Cervix exhibits no motion tenderness and no friability. Right adnexum displays no mass, no tenderness and no fullness. Left adnexum displays no mass, no tenderness and no fullness.  No breast masses, fibrocystic changes bilateral (outer/upper quadrants). Small  amount of whitish vaginal, non odorous discharge. Pap smear collected.  Musculoskeletal: She exhibits no edema or tenderness.  No major deformity appreciated or signs of synovitis.  Lymphadenopathy:    She has no cervical adenopathy.    She has no axillary adenopathy.       Right: No inguinal and no supraclavicular adenopathy present.       Left: No inguinal and no supraclavicular adenopathy present.  Neurological: She is alert and oriented to person, place, and time. She has normal strength. No cranial nerve deficit or sensory deficit.  Reflex Scores:      Bicep reflexes are 2+ on the right side and 2+ on the left side.      Patellar reflexes are 2+ on the right side and 2+ on the left side. Normal gait with no assistance needed.  Skin: Skin is warm. No rash noted. No erythema.  No suspicious lesions or erythematous rash appreciated.  Psychiatric: She has a normal mood and affect. Her behavior is normal.  Well groomed, good eye contact.  Nursing note and vitals reviewed.       Assessment & Plan:    Caydee was seen today for annual exam.  Diagnoses and all orders for this visit:  Visit for  preventive health examination   We discussed the importance of regular physical activity and healthy diet for prevention of chronic illness and/or complications. Preventive guidelines reviewed. Next CPE in 1-3 years.  -     Pap IG and HPV (high risk) DNA detection  Hyperglycemia  IFG. Wt loss thought a healthy diet and regular exercise recommended for prevention.  -     POC HgB A1c (5.8).  Hypokalemia  Most likely related with diuretic medicationt, she agrees with stopping it and we can re-check K+ in 2 months. Stop KCL and continue K+ rich diet for now.  Essential hypertension  Her blood pressure has been well controlled,Thiazide medication is most likely the cause of her hypokalemia, we discussed a few options: Continuing with nonpharmacologic treatment vs changing to another antihypertensive medication like Amlodipine.  She would like to try the former option and since her blood pressure has been in the lower normal range I think she can try nonpharmacologic treatment, we will recheck in 2 months. Low salt diet.   Protein in urine  Recent urine dip showed protein 3 + and RBC's 2+. Today I ordered microalb/Cr ratio. Normal renal function. Further recommendations will be given according to urine results.  Reviewing prior labs, she has had + blood and protein in urine for the past 2 years. In 2 months I will repeat urinalysis.  -     Microalbumin/Creatinine Ratio, Urine  Cervical cancer screening -     Pap IG and HPV (high risk) DNA detection      -Patient advised to return or notify a doctor immediately if symptoms worsen or persist or new concerns arise.    Betty G. Martinique, MD  [facility]. [place] office.

## 2016-05-22 NOTE — Patient Instructions (Addendum)
A few things to remember from today's visit:   1. Visit for preventive health examination  - Pap IG and HPV (high risk) DNA detection  2. Hyperglycemia  - POC HgB A1c  3. Hypokalemia  STOP potassium pills and continue with diet (foos rich in potassium).  4. Essential hypertension Try without medication. Goal less 140/90. Monitor blood pressure at home periodically.   5. Protein in urine  - Microalbumin/Creatinine Ratio, Urine  6. Cervical cancer screening  - Pap IG and HPV (high risk) DNA detection         - Healthy diet low in red meet and animal fat, high in vegetables and fruit + regular physical activity.  - Vaccines:  Tdap vaccine every 10 years.  Shingles vaccine recommended at age 28, could be given after 33 years of age but not sure about insurance coverage.  Pneumonia vaccines:  Prevnar 13 at 65 and Pneumovax at 56.  Screening recommendations for low/normal risk women:  Screening for diabetes at age 58-45 and every 3 years.  Cervical cancer prevention:  -HPV vaccination between 33-27 years old. -Pap smear starts at 33 years of age and continues periodically until 33 years old in low risk women. Pap smear every 3 years between 56 and 55 years old. Pap smear every 3 years between women 47 and older if pap smear negative and HPV screening negative.   -Breast cancer: Mammogram: There is disagreement between experts about when to start screening in low risk asymptomatic female (40-45-50 years). > 71 years old every 2 years.   Colon cancer screening: starts at 33 years old until 33 years old.    Minimum weekly activity to achieve important health benefits: Two hours and 30 minutes (150 minutes) of moderate-intensity* aerobic activity (e.g., brisk walking), plus muscle-strengthening activities for at least two days or One hour and 15 minutes (75 minutes) of vigorous-intensity? aerobic activity (e.g., jogging, running), plus muscle-strengthening  activities for at least two days or A combination of moderate- and vigorous-intensity aerobic activities equivalent to the recommendations above, plus muscle-strengthening activities for at least two days Flexibility exercises (static movements) should be performed at least two days per week at moderate intensity, preferably after aerobic or resistance exercises.Vina

## 2016-06-22 ENCOUNTER — Ambulatory Visit (INDEPENDENT_AMBULATORY_CARE_PROVIDER_SITE_OTHER): Payer: Commercial Managed Care - PPO | Admitting: Family Medicine

## 2016-06-22 ENCOUNTER — Encounter: Payer: Self-pay | Admitting: Family Medicine

## 2016-06-22 VITALS — BP 150/75 | HR 67 | Temp 98.5°F | Resp 12 | Ht 68.0 in | Wt 246.0 lb

## 2016-06-22 DIAGNOSIS — R809 Proteinuria, unspecified: Secondary | ICD-10-CM

## 2016-06-22 DIAGNOSIS — R600 Localized edema: Secondary | ICD-10-CM | POA: Diagnosis not present

## 2016-06-22 DIAGNOSIS — E038 Other specified hypothyroidism: Secondary | ICD-10-CM | POA: Diagnosis not present

## 2016-06-22 DIAGNOSIS — I10 Essential (primary) hypertension: Secondary | ICD-10-CM | POA: Diagnosis not present

## 2016-06-22 DIAGNOSIS — D259 Leiomyoma of uterus, unspecified: Secondary | ICD-10-CM

## 2016-06-22 LAB — POC URINALSYSI DIPSTICK (AUTOMATED)
BILIRUBIN UA: NEGATIVE
GLUCOSE UA: NEGATIVE
KETONES UA: NEGATIVE
Leukocytes, UA: NEGATIVE
Nitrite, UA: NEGATIVE
RBC UA: NEGATIVE
Spec Grav, UA: 1.025
UROBILINOGEN UA: 0.2
pH, UA: 7

## 2016-06-22 LAB — BASIC METABOLIC PANEL
BUN: 9 mg/dL (ref 7–25)
CHLORIDE: 105 mmol/L (ref 98–110)
CO2: 21 mmol/L (ref 20–31)
Calcium: 9.2 mg/dL (ref 8.6–10.2)
Creat: 0.75 mg/dL (ref 0.50–1.10)
GLUCOSE: 90 mg/dL (ref 65–99)
POTASSIUM: 4 mmol/L (ref 3.5–5.3)
SODIUM: 139 mmol/L (ref 135–146)

## 2016-06-22 LAB — TSH: TSH: 1.67 m[IU]/L

## 2016-06-22 MED ORDER — HYDROCHLOROTHIAZIDE 25 MG PO TABS: 25.0000 mg | ORAL_TABLET | Freq: Every day | ORAL | Status: DC

## 2016-06-22 NOTE — Patient Instructions (Signed)
A few things to remember from today's visit:   1. Essential hypertension  - Basic metabolic panel - hydrochlorothiazide (HYDRODIURIL) 25 MG tablet; Take 1 tablet (25 mg total) by mouth daily.  Dispense: 90 tablet; Refill: 0  2. Bilateral lower extremity edema  - POCT Urinalysis Dipstick (Automated) - Protein / Creatinine Ratio, Urine  3. Abnormal presence of protein in urine  - Protein / Creatinine Ratio, Urine  4. Other specified hypothyroidism  - TSH  *Hydrochlorthiazide, elevation of legs above heart level, low salt diet, avoid over-the-counter Aleve or ibuprofen. Potassium rich diet    If you sign-up for My chart, you can communicate easier with [place] in case you have any question or concern.

## 2016-06-22 NOTE — Progress Notes (Signed)
Pre visit review using our clinic review tool, if applicable. No additional management support is needed unless otherwise documented below in the visit note. 

## 2016-06-22 NOTE — Progress Notes (Signed)
HPI:  ACUTE VISIT:  [name] is a 35 y.o. female, who is here today complaining of 2-3 weeks of lower extremity edema, sudden onset.  Edema seems to be exacerbated by prolonged standing or sitting, it is resolved in the morning when she first gets up, and worse at the end of the day. She has not noted facial or hands edema. I saw her on 05/22/2016 for her regular physical, she has history of uterine fibroids, currently she is following with gynecologists.  She denies any leg pain, erythema, long travel, or cold extremities. She is on OCPs to treat symptoms of uterine fibroid. She denies sexual activity.  History of hypertension, in the past she was on chlorthalidone, discontinued because her blood pressure was "low", last OV her BP was in normal range with non pharmacologic treatment ,so decided not to resume medications. Also history of hyperkalemia.  She is reporting lab work done at work, elevated uric acid at 7.6in  04/2016.   She has not noted unusual headache, visual changes, exertional chest pain, dyspnea,  focal weakness, or skin rash.   Lab Results  Component Value Date   CREATININE 0.78 05/15/2016   BUN 9 05/15/2016   NA 137 05/15/2016   K 3.1* 05/15/2016   CL 103 05/15/2016   CO2 25 05/15/2016    Lab Results  Component Value Date   TSH 3.57 05/15/2016    She also had urinalysis done in May 2017 and reported protein +2 in urine. She denies gross hematuria or foam in urine.  She is not checking her blood pressure at home. No recent URI and no skin rash.   Review of Systems  Constitutional: Negative for fever, activity change, appetite change, fatigue and unexpected weight change.  HENT: Negative for mouth sores, nosebleeds and trouble swallowing.   Eyes: Negative for redness and visual disturbance.  Respiratory: Negative for cough, shortness of breath and wheezing.   Cardiovascular: Positive for leg swelling. Negative for chest pain and  palpitations.  Gastrointestinal: Negative for nausea, vomiting and abdominal pain.       Negative for changes in bowel habits.  Endocrine: Negative for cold intolerance and heat intolerance.  Genitourinary: Negative for dysuria, hematuria, decreased urine volume and difficulty urinating.  Musculoskeletal: Negative for myalgias, back pain and arthralgias.  Skin: Negative for color change and rash.  Neurological: Negative for seizures, syncope, weakness, numbness and headaches.  Hematological: Negative for adenopathy. Does not bruise/bleed easily.      Current Outpatient Prescriptions on File Prior to Visit  Medication Sig Dispense Refill  . IBUPROFEN PO Take by mouth as needed.    . norethindrone-ethinyl estradiol (CYCLAFEM,ALYACEN) 0.5/0.75/1-35 MG-MCG tablet Take 1 tablet by mouth daily.    . Omega-3 Fatty Acids (FISH OIL PO) Take 2 capsules by mouth daily.    Marland Kitchen thyroid (ARMOUR THYROID) 30 MG tablet TAKE 1 TABLET BY MOUTH EVERY DAY 90 tablet 1  . tretinoin (RETIN-A) 0.025 % cream Apply topically at bedtime. 45 g 1  . Multiple Vitamins-Minerals (MULTIVITAMIN PO) Take by mouth daily. Reported on 06/22/2016     No current facility-administered medications on file prior to visit.     Past Medical History  Diagnosis Date  . Thyroid disease     hypothyroid  . Hypertension    No Known Allergies  Social History   Social History  . Marital Status: Single    Spouse Name: N/A  . Number of Children: N/A  . Years of Education:  N/A   Social History Main Topics  . Smoking status: Never Smoker   . Smokeless tobacco: Never Used  . Alcohol Use: No  . Drug Use: No  . Sexual Activity:    Partners: Male    Birth Control/ Protection: Abstinence   Other Topics Concern  . None   Social History Narrative    Filed Vitals:   06/22/16 1623  BP: 150/75  Pulse: 67  Temp: 98.5 F (36.9 C)  Resp: 12   Body mass index is 37.41 kg/(m^2).   SpO2 Readings from Last 3 Encounters:    06/22/16 96%  05/22/16 98%  05/14/14 98%      Physical Exam  Constitutional: She is oriented to person, place, and time. She appears well-developed. No distress.  HENT:  Head: Atraumatic.  Eyes: Conjunctivae and EOM are normal. Pupils are equal, round, and reactive to light.  Neck: No JVD present.  Cardiovascular: Normal rate and regular rhythm.   No murmur heard. Pulses:      Dorsalis pedis pulses are 2+ on the right side, and 2+ on the left side.  Respiratory: Effort normal and breath sounds normal. No respiratory distress.  GI: Soft. She exhibits mass. There is no hepatomegaly. There is no tenderness.    Uterine high above umbilicus, Hx of fibroid.  Musculoskeletal: She exhibits edema (2+ LE edema bilateral). She exhibits no tenderness.  Lymphadenopathy:    She has no cervical adenopathy.  Neurological: She is alert and oriented to person, place, and time. She has normal strength. Coordination normal.  Skin: Skin is warm. No erythema.  Psychiatric: She has a normal mood and affect.  Well groomed, good eye contact.      ASSESSMENT AND PLAN:  Lab Results  Component Value Date   CREATININE 0.75 06/22/2016   BUN 9 06/22/2016   NA 139 06/22/2016   K 4.0 06/22/2016   CL 105 06/22/2016   CO2 21 06/22/2016    Lab Results  Component Value Date   TSH 1.67 06/22/2016     [name] was seen today for swelling in legs.  Diagnoses and all orders for this visit:  Essential hypertension   Not well controlled. Possible complications of elevated BP discussed. HCTZ to start. K+ rich diet for now, may need KCL depending of K+ level.  Annual eye examination. F/U in 6 weeks.  -     hydrochlorothiazide (HYDRODIURIL) 25 MG tablet; Take 1 tablet (25 mg total) by mouth daily.  -     Basic metabolic panel  Bilateral lower extremity edema  We discussed possible causes. No finding that suggests a serious problems, except for proteinuria. She will start HCTZ 25 mg  daily. LE elevation. Clearly instructed about warning signs.  -     POCT Urinalysis Dipstick (Automated)   -     Basic metabolic panel  Abnormal presence of protein in urine  Renal function in 04/2016 was 109.8. Today urine dip +2 protein, so urine sent out for protein/Cr ratio, if positive nephrology referral will be arranged. Avoid NSAID's. Adequate hydration. Low salt diet.  -     Protein / Creatinine Ratio, Urine  Other specified hypothyroidism  -     TSH  Uterine leiomyoma, unspecified location  She needs a local gynecologists, uterine fibroid seems to be bigger than last OV, 04/2016.  -     Ambulatory referral to Gynecology         -Ms.Beautifull Riesberg advised to return or notify a doctor immediately if  symptoms worsen or persist or new concerns arise, she voices understanding.   Addendum: Protein/Cr in urine elevated, so nephrology referral was placed. Rest labs within normal limit.   Khaza Blansett G. Martinique, MD  [facility]. [place] office.

## 2016-06-23 ENCOUNTER — Encounter: Payer: Self-pay | Admitting: Family Medicine

## 2016-06-23 LAB — PROTEIN / CREATININE RATIO, URINE
Creatinine, Urine: 225 mg/dL (ref 20–320)
Protein Creatinine Ratio: 462 mg/g creat — ABNORMAL HIGH (ref 21–161)
Total Protein, Urine: 104 mg/dL — ABNORMAL HIGH (ref 5–24)

## 2016-07-04 ENCOUNTER — Other Ambulatory Visit: Payer: Self-pay | Admitting: *Deleted

## 2016-07-04 DIAGNOSIS — D259 Leiomyoma of uterus, unspecified: Secondary | ICD-10-CM

## 2016-07-16 ENCOUNTER — Ambulatory Visit (INDEPENDENT_AMBULATORY_CARE_PROVIDER_SITE_OTHER): Payer: Commercial Managed Care - PPO | Admitting: Family Medicine

## 2016-07-16 ENCOUNTER — Encounter: Payer: Self-pay | Admitting: Family Medicine

## 2016-07-16 VITALS — BP 130/80 | HR 67 | Temp 98.5°F | Ht 68.0 in | Wt 239.1 lb

## 2016-07-16 DIAGNOSIS — I10 Essential (primary) hypertension: Secondary | ICD-10-CM

## 2016-07-16 DIAGNOSIS — Z6836 Body mass index (BMI) 36.0-36.9, adult: Secondary | ICD-10-CM | POA: Diagnosis not present

## 2016-07-16 DIAGNOSIS — R809 Proteinuria, unspecified: Secondary | ICD-10-CM | POA: Diagnosis not present

## 2016-07-16 HISTORY — DX: Body mass index (BMI) 36.0-36.9, adult: Z68.36

## 2016-07-16 LAB — BASIC METABOLIC PANEL
BUN: 8 mg/dL (ref 6–23)
CALCIUM: 9.1 mg/dL (ref 8.4–10.5)
CO2: 27 mEq/L (ref 19–32)
Chloride: 100 mEq/L (ref 96–112)
Creatinine, Ser: 0.7 mg/dL (ref 0.40–1.20)
GFR: 124.25 mL/min (ref >60.00)
GLUCOSE: 87 mg/dL (ref 70–99)
Potassium: 3.7 mEq/L (ref 3.5–5.1)
SODIUM: 136 meq/L (ref 135–145)

## 2016-07-16 MED ORDER — HYDROCHLOROTHIAZIDE 25 MG PO TABS: 25.0000 mg | ORAL_TABLET | Freq: Every day | ORAL | 1 refills | Status: DC

## 2016-07-16 NOTE — Progress Notes (Signed)
[name] is a 35 y.o.female, who is here today to follow on HTN, last OV 06/22/16.  Last office visit she was concerned about lower extremity edema, she is reporting resolution of problem.   Currently she is on HCTZ 25 mg daily. She is taking medications as instructed, no side effects reported.  She has not noted unusual headache, visual changes, exertional chest pain, dyspnea,  focal weakness, or edema.  She has history of hyperkalemia.  Hx of proteinuria, nephrologist evaluation is pending. She denies any gross hematuria or decreased urine output, she has noted in urine. No arthralgias, skin rash, oral lesions, or alopecia.  She has history of fibroids, appointment with gynecologist 07/25/2016, according to patient.  She is eating healthier, has had some weight loss since her last visit. She still not exercising regularly.   Lab Results  Component Value Date   WBC 6.4 05/15/2016   HGB 12.3 05/15/2016   HCT 36.7 05/15/2016   MCV 83.0 05/15/2016   PLT 298.0 05/15/2016     Lab Results  Component Value Date   CREATININE 0.75 06/22/2016   BUN 9 06/22/2016   NA 139 06/22/2016   K 4.0 06/22/2016   CL 105 06/22/2016   CO2 21 06/22/2016   Lab Results  Component Value Date   TSH 1.67 06/22/2016    Lab Results  Component Value Date   CHOL 262 (H) 05/15/2016   HDL 31.70 (L) 05/15/2016   LDLCALC 192 (H) 05/15/2016   TRIG 188.0 (H) 05/15/2016   CHOLHDL 8 05/15/2016     Review of Systems  Constitutional: Negative for activity change, chills, diaphoresis, fatigue, fever and unexpected weight change.  HENT: Negative for nosebleeds and sore throat.   Eyes: Negative for pain.  Respiratory: Negative for cough, shortness of breath and wheezing.   Cardiovascular: Negative for chest pain, palpitations and leg swelling.  Gastrointestinal: Negative for abdominal pain, nausea and vomiting.       No changes in bowel habits.  Endocrine: Negative for cold intolerance,  heat intolerance, polydipsia and polyphagia.  Genitourinary: Negative for dysuria, frequency, hematuria, urgency, vaginal bleeding and vaginal discharge.       No decreased urine output.  Musculoskeletal: Negative for arthralgias, back pain, joint swelling and myalgias.  Skin: Negative for rash and wound.  Neurological: Negative for seizures, syncope, weakness, numbness and headaches.  Hematological: Negative for adenopathy. Does not bruise/bleed easily.  Psychiatric/Behavioral: Negative for behavioral problems. The patient is not nervous/anxious.      Current Outpatient Prescriptions on File Prior to Visit  Medication Sig Dispense Refill  . Multiple Vitamins-Minerals (MULTIVITAMIN PO) Take by mouth daily. Reported on 06/22/2016    . norethindrone-ethinyl estradiol (CYCLAFEM,ALYACEN) 0.5/0.75/1-35 MG-MCG tablet Take 1 tablet by mouth daily.    . Omega-3 Fatty Acids (FISH OIL PO) Take 2 capsules by mouth daily.    Marland Kitchen thyroid (ARMOUR THYROID) 30 MG tablet TAKE 1 TABLET BY MOUTH EVERY DAY 90 tablet 1  . tretinoin (RETIN-A) 0.025 % cream Apply topically at bedtime. 45 g 1   No current facility-administered medications on file prior to visit.      Past Medical History:  Diagnosis Date  . Hyperlipidemia   . Hypertension   . Thyroid disease    hypothyroid    No Known Allergies  Social History   Social History  . Marital status: Single    Spouse name: N/A  . Number of children: N/A  . Years of education: N/A   Social  History Main Topics  . Smoking status: Never Smoker  . Smokeless tobacco: Never Used  . Alcohol use No  . Drug use: No  . Sexual activity: Not Currently    Partners: Male    Birth control/ protection: Abstinence   Other Topics Concern  . None   Social History Narrative  . None    Vitals:   07/16/16 1054  BP: 130/80  Pulse: 67  Temp: 98.5 F (36.9 C)   Body mass index is 36.36 kg/m.  Wt Readings from Last 3 Encounters:  07/16/16 239 lb 2 oz (108.5  kg)  06/22/16 246 lb (111.6 kg)  05/22/16 234 lb 4.8 oz (106.3 kg)     Physical Exam  Nursing note and vitals reviewed. Constitutional: She is oriented to person, place, and time. She appears well-developed. No distress.  HENT:  Head: Atraumatic.  Eyes: Conjunctivae and EOM are normal.  Neck: No thyroid mass and no thyromegaly present.  Cardiovascular: Normal rate and regular rhythm.   No murmur heard. Pulses:      Dorsalis pedis pulses are 2+ on the right side, and 2+ on the left side.  Respiratory: Effort normal and breath sounds normal. No respiratory distress.  GI: Soft. She exhibits mass. There is no hepatomegaly. There is no tenderness.    Uterine high above umbilicus, Hx of fibroid.  Musculoskeletal: She exhibits no edema or tenderness.  Lymphadenopathy:    She has no cervical adenopathy.  Neurological: She is alert and oriented to person, place, and time. She has normal strength. Coordination normal.  Skin: Skin is warm. No rash noted. No erythema.  Psychiatric: She has a normal mood and affect.  Well groomed, good eye contact.    ASSESSMENT AND PLAN:   [name] was seen today for follow-up.  Diagnoses and all orders for this visit:  Essential hypertension  Improved and adequately controlled. No changes in current management, HCTZ. Because Hx of hypoK+, today BMP. Low salt diet recommended. Eye exam periodically, every 1-2 years. F/U in 5-6 months, before if needed.  -     US Renal; Future -     hydrochlorothiazide (HYDRODIURIL) 25 MG tablet; Take 1 tablet (25 mg total) by mouth daily. -     Basic Metabolic Panel  Proteinuria of undiagnosed cause  Unknown etiology. Nephrologist appointment is still pending.  I will go ahead and order renal ultrasound plus some lab work to rule out possible etiologies. HIV reported as done in 2014.  Avoid NSAIDs, stay adequately hydrated, follow a low salt diet, and adequate blood pressure control recommended.  -     US  Renal; Future -     ANA -     Protein, urine, 24 hour; Future -     Protein Electro, Random Urine -     Protein, urine, 24 hour  BMI 36.0-36.9,adult  She lost about 7 pounds since her last office visit. We discussed benefits of wt loss as well as adverse effects of obesity. Consistency with healthy diet and physical activity recommended. Daily brisk walking for 15-30 min as tolerated.     -She was advised to return sooner than planned today if new concerns arise. -Keep appointment with gynecologist. -I will check on appointment with nephrologist, referral was placed a few weeks ago.    Raylan Troiani G. Martinique, MD  [facility]. [place] office.

## 2016-07-16 NOTE — Patient Instructions (Signed)
A few things to remember from today's visit:   Essential hypertension - Plan: US Renal, hydrochlorothiazide (HYDRODIURIL) 25 MG tablet  Proteinuria of undiagnosed cause - Plan: US Renal, ANA, Protein, urine, 24 hour  BMI 36.0-36.9,adult  Great job, lost a few pounds since last office visit.  Start walking 7 days per week 30 min, brisk walking. Continue healthy diet.  Nephrologist appointment pending. Keep appointment with gynecologist.   Please be sure medication list is accurate. If a new problem present, please set up appointment sooner than planned today.

## 2016-07-16 NOTE — Progress Notes (Signed)
Pre visit review using our clinic review tool, if applicable. No additional management support is needed unless otherwise documented below in the visit note. 

## 2016-07-17 ENCOUNTER — Encounter: Payer: Self-pay | Admitting: Family Medicine

## 2016-07-17 LAB — ANA: ANA: NEGATIVE

## 2016-07-19 NOTE — Addendum Note (Signed)
Addended by: Gari Crown D on: 07/19/2016 02:33 PM   Modules accepted: Orders

## 2016-07-19 NOTE — Addendum Note (Signed)
Addended by: Gari Crown D on: 07/19/2016 02:32 PM   Modules accepted: Orders

## 2016-07-23 ENCOUNTER — Encounter: Payer: Self-pay | Admitting: Family Medicine

## 2016-07-23 LAB — PROTEIN, URINE, 24 HOUR
PROTEIN, URINE: 79 mg/dL — AB (ref 5–24)
Protein, 24H Urine: 711 mg/24 h — ABNORMAL HIGH (ref <150)

## 2016-07-25 ENCOUNTER — Ambulatory Visit: Payer: Commercial Managed Care - PPO | Admitting: Family Medicine

## 2016-07-26 ENCOUNTER — Ambulatory Visit (HOSPITAL_COMMUNITY): Payer: Commercial Managed Care - PPO

## 2016-07-27 ENCOUNTER — Encounter: Payer: Self-pay | Admitting: Family Medicine

## 2016-07-27 ENCOUNTER — Ambulatory Visit
Admission: RE | Admit: 2016-07-27 | Discharge: 2016-07-27 | Disposition: A | Discharge status: Home / Self Care | Payer: Commercial Managed Care - PPO | Source: Ambulatory Visit | Attending: Family Medicine | Admitting: Family Medicine

## 2016-07-27 DIAGNOSIS — R809 Proteinuria, unspecified: Secondary | ICD-10-CM

## 2016-07-27 DIAGNOSIS — I10 Essential (primary) hypertension: Secondary | ICD-10-CM

## 2016-08-01 LAB — BASIC METABOLIC PANEL
BUN: 7 mg/dL (ref 4–21)
Creatinine: 0.8 mg/dL (ref 0.5–1.1)
Glucose: 106 mg/dL
POTASSIUM: 4 mmol/L (ref 3.4–5.3)
Sodium: 136 mmol/L — AB (ref 137–147)

## 2016-08-01 LAB — HEPATIC FUNCTION PANEL
ALK PHOS: 43 U/L (ref 25–125)
ALT: 9 U/L (ref 7–35)
AST: 11 U/L — AB (ref 13–35)
Bilirubin, Total: 0.2 mg/dL

## 2016-08-01 LAB — CBC AND DIFFERENTIAL
HEMATOCRIT: 37 % (ref 36–46)
HEMOGLOBIN: 12.9 g/dL (ref 12.0–16.0)
Neutrophils Absolute: 4 /uL
PLATELETS: 309 10*3/uL (ref 150–399)
WBC: 6.4 10*3/mL

## 2016-08-02 ENCOUNTER — Encounter: Payer: Self-pay | Admitting: Family Medicine

## 2016-08-02 ENCOUNTER — Encounter: Payer: Commercial Managed Care - PPO | Admitting: Obstetrics & Gynecology

## 2016-11-13 ENCOUNTER — Other Ambulatory Visit: Payer: Self-pay | Admitting: Family Medicine

## 2016-11-13 DIAGNOSIS — E038 Other specified hypothyroidism: Secondary | ICD-10-CM

## 2017-01-16 ENCOUNTER — Ambulatory Visit: Payer: Commercial Managed Care - PPO | Admitting: Family Medicine

## 2017-01-23 ENCOUNTER — Ambulatory Visit (INDEPENDENT_AMBULATORY_CARE_PROVIDER_SITE_OTHER): Payer: Commercial Managed Care - PPO | Admitting: Internal Medicine

## 2017-01-23 ENCOUNTER — Telehealth: Payer: Self-pay | Admitting: Family Medicine

## 2017-01-23 ENCOUNTER — Encounter: Payer: Self-pay | Admitting: Internal Medicine

## 2017-01-23 VITALS — BP 124/84 | Temp 98.5°F | Wt 243.0 lb

## 2017-01-23 DIAGNOSIS — R51 Headache: Secondary | ICD-10-CM

## 2017-01-23 DIAGNOSIS — H9202 Otalgia, left ear: Secondary | ICD-10-CM | POA: Diagnosis not present

## 2017-01-23 DIAGNOSIS — R519 Headache, unspecified: Secondary | ICD-10-CM

## 2017-01-23 NOTE — Patient Instructions (Addendum)
No ear infection    Ear pain is often a referred pain from another structure .   Ok to take   Ibuprofen . For pain   If getting fever progression or not better by next week.   Then get back with [place] for reevaluation or advice .  Avoid  Lots of chewing gum  .  For now  For jaw joint rest.

## 2017-01-23 NOTE — Telephone Encounter (Signed)
Patient Name: Kaitlyn Cox  DOB: 09/02/1981    Initial Comment Caller had a headache on Monday and ever since has been having an intermittent pain in left ear. The pain only happens when she is up and moving, it doesn't bother her when she is laying down. Accompanied by sharp pain in her neck.    Nurse Assessment  Nurse: Raphael Gibney, RN, Vanita Ingles Date/Time (Eastern Time): 01/23/2017 11:17:34 AM  Confirm and document reason for call. If symptomatic, describe symptoms. ---Caller states she has had a headache since Monday. Has sharp pain in her neck and in her left ear. Earache comes and goes. Does not think she has a fever No sinus congestion or cough.  Does the patient have any new or worsening symptoms? ---Yes  Will a triage be completed? ---Yes  Related visit to physician within the last 2 weeks? ---No  Does the PT have any chronic conditions? (i.e. diabetes, asthma, etc.) ---Yes  List chronic conditions. ---insulin resistance; HTN  Is the patient pregnant or possibly pregnant? (Ask all females between the ages of 33-55) ---No  Is this a behavioral health or substance abuse call? ---No     Guidelines    Guideline Title Affirmed Question Affirmed Notes  Headache [1] MODERATE headache (e.g., interferes with normal activities) AND [2] present > 24 hours AND [3] unexplained (Exceptions: analgesics not tried, typical migraine, or headache part of viral illness)    Final Disposition User   See Physician within 24 Hours [place], RN, Vanita Ingles    Comments  appt scheduled at 4 pm 01/23/2017 with Dr. Mariann Laster Panosh  pain in her neck is intermittent   Referrals  REFERRED TO PCP OFFICE   Disagree/Comply: Comply

## 2017-01-23 NOTE — Progress Notes (Signed)
Pre visit review using our clinic review tool, if applicable. No additional management support is needed unless otherwise documented below in the visit note.  Chief Complaint  Patient presents with  . Headache    Started on Monday.  . Neck Pain  . Left Ear Pain    HPI: Kaitlyn Cox 36 y.o.  sda  For HA  For 2 days PCP NA no fever Intermittent level 6-7 left ear pain and left lateral neck pain somewhat into the left occiput. Without associated symptoms. No vision changes nausea vomiting photophobia and no history of head trauma.  No nv vision changes.   No cold sx   . Hx of siunus issues .   Tried ibuprofen helps  But comes back .  Not nocturnal when she's lying down it seems to be better. No pina with chewing or swallowing   ROS: See pertinent positives and negatives per HPI. Dental appointment last month we'll need to have her wisdom teeth out. But no specific pain with chewing. Hearing is fine no neurologic symptoms.  Past Medical History:  Diagnosis Date  . Hyperlipidemia   . Hypertension   . Thyroid disease    hypothyroid    Family History  Problem Relation Age of Onset  . Diabetes Mother   . Anxiety disorder Mother   . Hypertension Mother   . Anxiety disorder Father   . Hypertension Father   . Diabetes Maternal Grandmother   . Breast cancer Maternal Grandmother   . Lung cancer Paternal Grandfather     lung    Social History   Social History  . Marital status: Single    Spouse name: N/A  . Number of children: N/A  . Years of education: N/A   Social History Main Topics  . Smoking status: Never Smoker  . Smokeless tobacco: Never Used  . Alcohol use No  . Drug use: No  . Sexual activity: Not Currently    Partners: Male    Birth control/ protection: Abstinence   Other Topics Concern  . None   Social History Narrative  . None    Outpatient Medications Prior to Visit  Medication Sig Dispense Refill  . ARMOUR THYROID 30 MG tablet TAKE 1 TABLET EVERY  DAY 90 tablet 1  . hydrochlorothiazide (HYDRODIURIL) 25 MG tablet Take 1 tablet (25 mg total) by mouth daily. 90 tablet 1  . tretinoin (RETIN-A) 0.025 % cream Apply topically at bedtime. 45 g 1  . Multiple Vitamins-Minerals (MULTIVITAMIN PO) Take by mouth daily. Reported on 06/22/2016    . norethindrone-ethinyl estradiol (CYCLAFEM,ALYACEN) 0.5/0.75/1-35 MG-MCG tablet Take 1 tablet by mouth daily.    . Omega-3 Fatty Acids (FISH OIL PO) Take 2 capsules by mouth daily.     No facility-administered medications prior to visit.      EXAM:  BP 124/84 (BP Location: Left Arm, Patient Position: Sitting, Cuff Size: Large)   Temp 98.5 F (36.9 C) (Oral)   Wt 243 lb (110.2 kg)   BMI 36.95 kg/m   Body mass index is 36.95 kg/m.  GENERAL: vitals reviewed and listed above, alert, oriented, appears well hydrated and in no acute distress HEENT: atraumatic, conjunctiva  clear, no obvious abnormalities on inspection of external nose and earsBut there is old piercing and the superior portion of the left ear is somewhat red but not thick and swollen or sticking out. There is no specific rash. TMs are clear negative TMJ clicking no pre-or radicular pain OP : no lesion edema or  exudate  Teeth no redness or swelling  NECK: no obvious masses on inspection palpation  NEURO: oriented x 3 CN 3-12 appear intact. No focal muscle weakness or atrophy. DTRs symmetrical. Gait WNL.  Grossly non focal. No tremor or abnormal movement. CV: HRRR, no clubbing cyanosis or  peripheral edema nl cap refill  MS: moves all extremities without noticeable focal  abnormality PSYCH: pleasant and cooperative, no obvious depression or anxiety  ASSESSMENT AND PLAN:  Discussed the following assessment and plan:  Ear pain, left  Acute nonintractable headache, unspecified headache type left lower head pain - see text. Unrevealing exam relieved by ibuprofen and position change. Only thing abnormal on exam is some mild to moderate redness  of her hand without swelling or lesion. Expectant management observe for alarm features or persistent. See instructions. -Patient advised to return or notify health care team  if symptoms worsen ,persist or new concerns arise.  Patient Instructions  No ear infection    Ear pain is often a referred pain from another structure .   Ok to take   Ibuprofen . For pain   If getting fever progression or not better by next week.   Then get back with [place] for reevaluation or advice .  Avoid  Lots of chewing gum  .  For now  For jaw joint rest.          Standley Brooking. Panosh M.D.

## 2017-01-23 NOTE — Telephone Encounter (Signed)
Noted  

## 2017-06-11 DIAGNOSIS — E782 Mixed hyperlipidemia: Secondary | ICD-10-CM | POA: Insufficient documentation

## 2017-06-11 NOTE — Progress Notes (Deleted)
HPI:   [name] is a 36 y.o. female, who is here today for her routine physical.  Last seen on 07/16/16.  Regular exercise 3 or more time per week: *** Following a healthy diet: *** She lives with ***  Chronic medical problems: HTN, proteinuria, hypoK+. She follows with nephrologists.  Pap smear 05/22/2016  Hx of abnormal pap smears: *** Hx of STD's ***   She has *** concerns today.   HLD:  Following low fat diet: ***  Lab Results  Component Value Date   CHOL 262 (H) 05/15/2016   HDL 31.70 (L) 05/15/2016   LDLCALC 192 (H) 05/15/2016   TRIG 188.0 (H) 05/15/2016   CHOLHDL 8 05/15/2016   Hypothyroidism:  Currently she is on Armour Thyroid 30 mg daily. Tolerating medication well, no side effects reported. *** has not noted dysphagia, palpitations, abdominal pain, changes in bowel habits, tremor, cold/heat intolerance, or abnormal weight loss.  Lab Results  Component Value Date   TSH 1.67 06/22/2016    Hypertension:   Since 2014. Currently on Cozaar 25 mg daily.   ***taking medications as instructed, no side effects reported.  ***has not noted unusual headache, visual changes, exertional chest pain, dyspnea,  focal weakness, or edema.   Lab Results  Component Value Date   CREATININE 0.8 08/01/2016   BUN 7 08/01/2016   NA 136 (A) 08/01/2016   K 4.0 08/01/2016   CL 100 07/16/2016   CO2 27 07/16/2016     Review of Systems    Current Outpatient Prescriptions on File Prior to Visit  Medication Sig Dispense Refill  . ARMOUR THYROID 30 MG tablet TAKE 1 TABLET EVERY DAY 90 tablet 1  . hydrochlorothiazide (HYDRODIURIL) 25 MG tablet Take 1 tablet (25 mg total) by mouth daily. 90 tablet 1  . losartan (COZAAR) 25 MG tablet Take 1 tablet daily    . Multiple Vitamins-Minerals (MULTIVITAMIN PO) Take by mouth daily. Reported on 06/22/2016    . norethindrone-ethinyl estradiol (CYCLAFEM,ALYACEN) 0.5/0.75/1-35 MG-MCG tablet Take 1 tablet by mouth  daily.    . Omega-3 Fatty Acids (FISH OIL PO) Take 2 capsules by mouth daily.    Marland Kitchen tretinoin (RETIN-A) 0.025 % cream Apply topically at bedtime. 45 g 1   No current facility-administered medications on file prior to visit.      Past Medical History:  Diagnosis Date  . Hyperlipidemia   . Hypertension   . Thyroid disease    hypothyroid    No Known Allergies  Family History  Problem Relation Age of Onset  . Diabetes Mother   . Anxiety disorder Mother   . Hypertension Mother   . Anxiety disorder Father   . Hypertension Father   . Diabetes Maternal Grandmother   . Breast cancer Maternal Grandmother   . Lung cancer Paternal Grandfather        lung    Social History   Social History  . Marital status: Single    Spouse name: N/A  . Number of children: N/A  . Years of education: N/A   Social History Main Topics  . Smoking status: Never Smoker  . Smokeless tobacco: Never Used  . Alcohol use No  . Drug use: No  . Sexual activity: Not Currently    Partners: Male    Birth control/ protection: Abstinence   Other Topics Concern  . Not on file   Social History Narrative  . No narrative on file     There were no  vitals filed for this visit. There is no height or weight on file to calculate BMI.  @LASTSAO2 (3)@  Wt Readings from Last 3 Encounters:  01/23/17 243 lb (110.2 kg)  07/16/16 239 lb 2 oz (108.5 kg)  06/22/16 246 lb (111.6 kg)          Physical Exam    ASSESSMENT AND PLAN:      There are no diagnoses linked to this encounter.           No Follow-up on file.          Janelis Stelzer G. Martin[place], MD  [facility]. [place] office.

## 2017-06-12 ENCOUNTER — Encounter: Payer: Commercial Managed Care - PPO | Admitting: Family Medicine

## 2017-06-18 NOTE — Progress Notes (Signed)
HPI:   Kaitlyn Cox is a 36 y.o. female, who is here today for her routine physical.  Last seen on 07/16/16.  Regular exercise 3 or more time per week: Not consistently  Following a healthy diet: Try to do better. She lives alone, family lives in [place].  Chronic medical problems: HTN, proteinuria, hypoK+. She follows with nephrologists.  She is following q 4-6 months with nephrologists, Hx of proteinuria. Currently she is on Cozaar 25 mg daily.   Pap smear 05/22/2016  Hx of abnormal pap smears: Denies. Hx of STD's Denies. She is sexually active.  M:10 G:0  LMP 1 week ago, she is on OCP's.  Hx of heavy menses and fibroids, she has not started treatment. She follows with gyn in [place] , still would like to have pap smear today.   HLD: She is on non pharmacologic treatment. Following low fat diet: Yes.  Lab Results  Component Value Date   CHOL 262 (H) 05/15/2016   HDL 31.70 (L) 05/15/2016   LDLCALC 192 (H) 05/15/2016   TRIG 188.0 (H) 05/15/2016   CHOLHDL 8 05/15/2016   Hypothyroidism:  Currently she is on Armour Thyroid 30 mg daily. Tolerating medication well, no side effects reported. She has not noted dysphagia, palpitations, abdominal pain, changes in bowel habits, tremor, cold/heat intolerance, or abnormal weight loss.  Lab Results  Component Value Date   TSH 1.67 06/22/2016    Hypertension:   Since 2014. Currently on Cozaar 25 mg daily and HCTZ 25 mg daily.    She has not noted unusual headache, visual changes, exertional chest pain, dyspnea,  focal weakness, or edema.   Lab Results  Component Value Date   CREATININE 0.8 08/01/2016   BUN 7 08/01/2016   NA 136 (A) 08/01/2016   K 4.0 08/01/2016   CL 100 07/16/2016   CO2 27 07/16/2016   Concerns today:  She has Hx of lower back pain, no radiated, mild. She is requesting a letter ,so she can get a standing desk through her employer. Back pain is exacerbated by prolonged  sitting/standing and alleviated by position changes.  Review of Systems  Constitutional: Negative for appetite change, fatigue, fever and unexpected weight change.  HENT: Negative for dental problem, hearing loss, nosebleeds, trouble swallowing and voice change.   Eyes: Negative for redness and visual disturbance.  Respiratory: Negative for cough, shortness of breath and wheezing.   Cardiovascular: Negative for chest pain and leg swelling.  Gastrointestinal: Negative for abdominal pain, blood in stool, nausea and vomiting.       No changes in bowel habits.  Endocrine: Negative for cold intolerance, heat intolerance, polydipsia, polyphagia and polyuria.  Genitourinary: Negative for decreased urine volume, dyspareunia, dysuria, hematuria, vaginal bleeding and vaginal discharge.       No breast tenderness or nipple discharge.  Musculoskeletal: Positive for back pain. Negative for arthralgias and gait problem.  Skin: Negative for rash.  Neurological: Negative for syncope, weakness, numbness and headaches.  Hematological: Negative for adenopathy. Does not bruise/bleed easily.  Psychiatric/Behavioral: Negative for confusion and sleep disturbance. The patient is not nervous/anxious.   All other systems reviewed and are negative.     Current Outpatient Prescriptions on File Prior to Visit  Medication Sig Dispense Refill  . ARMOUR THYROID 30 MG tablet TAKE 1 TABLET EVERY DAY 90 tablet 1  . hydrochlorothiazide (HYDRODIURIL) 25 MG tablet Take 1 tablet (25 mg total) by mouth daily. 90 tablet 1  . losartan (COZAAR) 25  MG tablet Take 1 tablet daily    . norethindrone-ethinyl estradiol (CYCLAFEM,ALYACEN) 0.5/0.75/1-35 MG-MCG tablet Take 1 tablet by mouth daily.    Marland Kitchen tretinoin (RETIN-A) 0.025 % cream Apply topically at bedtime. 45 g 1   No current facility-administered medications on file prior to visit.      Past Medical History:  Diagnosis Date  . Hyperlipidemia   . Hypertension   . Thyroid  disease    hypothyroid    No Known Allergies  Family History  Problem Relation Age of Onset  . Diabetes Mother   . Anxiety disorder Mother   . Hypertension Mother   . Anxiety disorder Father   . Hypertension Father   . Diabetes Maternal Grandmother   . Breast cancer Maternal Grandmother   . Lung cancer Paternal Grandfather        lung    Social History   Social History  . Marital status: Single    Spouse name: N/A  . Number of children: N/A  . Years of education: N/A   Social History Main Topics  . Smoking status: Never Smoker  . Smokeless tobacco: Never Used  . Alcohol use No  . Drug use: No  . Sexual activity: Not Currently    Partners: Male    Birth control/ protection: Abstinence   Other Topics Concern  . None   Social History Narrative  . None     Vitals:   06/19/17 0834  BP: 118/80  Pulse: 85  Resp: 12   Body mass index is 36.72 kg/m.  O2 sat at RA 97%  Wt Readings from Last 3 Encounters:  06/19/17 241 lb 8 oz (109.5 kg)  01/23/17 243 lb (110.2 kg)  07/16/16 239 lb 2 oz (108.5 kg)    Physical Exam  Nursing note and vitals reviewed. Constitutional: She is oriented to person, place, and time. She appears well-developed. No distress.  HENT:  Head: Atraumatic.  Right Ear: Hearing, tympanic membrane, external ear and ear canal normal.  Left Ear: Hearing, tympanic membrane, external ear and ear canal normal.  Mouth/Throat: Uvula is midline, oropharynx is clear and moist and mucous membranes are normal.  Eyes: Conjunctivae and EOM are normal. Pupils are equal, round, and reactive to light.  Neck: No tracheal deviation present. No thyromegaly present.  Cardiovascular: Normal rate and regular rhythm.   No murmur heard. Pulses:      Dorsalis pedis pulses are 2+ on the right side, and 2+ on the left side.  Respiratory: Effort normal and breath sounds normal. No respiratory distress.  GI: Soft. She exhibits mass (Presumed to be fibroid, extending  to supra umbilical area. ). There is no hepatomegaly. There is no tenderness.  Genitourinary: No breast swelling or tenderness. There is no tenderness or lesion on the right labia. There is no tenderness or lesion on the left labia. Uterus is deviated and enlarged. Cervix exhibits no motion tenderness, no discharge and no friability. Right adnexum displays no mass and no tenderness. Left adnexum displays no mass and no tenderness. No erythema, tenderness or bleeding in the vagina. No vaginal discharge found.  Genitourinary Comments: Breast: No masses,skin changes,or nipple discharge. Pap smear collected.  Musculoskeletal: She exhibits no edema.  No major deformity or signs of synovitis appreciated.  Lymphadenopathy:    She has no cervical adenopathy.    She has no axillary adenopathy.       Right: No inguinal and no supraclavicular adenopathy present.       Left: No  inguinal and no supraclavicular adenopathy present.  Neurological: She is alert and oriented to person, place, and time. She has normal strength. No cranial nerve deficit. Coordination and gait normal.  Reflex Scores:      Bicep reflexes are 2+ on the right side and 2+ on the left side.      Patellar reflexes are 2+ on the right side and 2+ on the left side. Skin: Skin is warm. No rash noted. No erythema.  Psychiatric: She has a normal mood and affect. Cognition and memory are normal.  Well groomed, good eye contact.    ASSESSMENT AND PLAN:   Ms Calin was seen today for annual exam and gynecologic exam.  Diagnoses and all orders for this visit:  Lab Results  Component Value Date   CREATININE 0.82 06/19/2017   BUN 10 06/19/2017   NA 136 06/19/2017   K 4.1 06/19/2017   CL 101 06/19/2017   CO2 27 06/19/2017   Lab Results  Component Value Date   TSH 1.93 06/19/2017    Routine general medical examination at a health care facility  We discussed the importance of regular physical activity and healthy diet for prevention  of chronic illness and/or complications. Preventive guidelines reviewed. Vaccination up to date.  Next CPE in 1-2 years.  -     Basic metabolic panel -     Lipid panel  Essential hypertension  Adequately controlled. No changes in current management. DASH-low salt diet to continue. Eye exam recommended annually. F/U in 12 months since she is following with nephrologists.  Other specified hypothyroidism  No changes in current management, will follow labs done today and will give further recommendations accordingly. F/U in a year.  -     T4, free -     T3, free -     TSH  Hyperlipidemia, mixed  Continue low fat diet. Further recommendations will be given according to lab results.  Screening for cervical cancer -     PAP [[place]]  Screening for HIV (human immunodeficiency virus) -     HIV antibody (with reflex)  Chronic bilateral low back pain without sciatica  Letter will be provided. Wt loss may also help. Tylenol OTC tid as needed. She is not interested in muscle relaxants.    Return in about 1 year (around 06/19/2018) for routine.      Silus Lanzo G. Martinique, MD  [facility]. [place] office.

## 2017-06-19 ENCOUNTER — Ambulatory Visit (INDEPENDENT_AMBULATORY_CARE_PROVIDER_SITE_OTHER): Payer: Commercial Managed Care - PPO | Admitting: Family Medicine

## 2017-06-19 ENCOUNTER — Other Ambulatory Visit (HOSPITAL_COMMUNITY)
Admission: RE | Admit: 2017-06-19 | Discharge: 2017-06-19 | Disposition: A | Discharge status: Home / Self Care | Payer: Commercial Managed Care - PPO | Source: Ambulatory Visit | Attending: Family Medicine | Admitting: Family Medicine

## 2017-06-19 ENCOUNTER — Encounter: Payer: Self-pay | Admitting: Family Medicine

## 2017-06-19 VITALS — BP 118/80 | HR 85 | Resp 12 | Ht 68.0 in | Wt 241.5 lb

## 2017-06-19 DIAGNOSIS — I10 Essential (primary) hypertension: Secondary | ICD-10-CM | POA: Diagnosis not present

## 2017-06-19 DIAGNOSIS — E782 Mixed hyperlipidemia: Secondary | ICD-10-CM

## 2017-06-19 DIAGNOSIS — M545 Low back pain, unspecified: Secondary | ICD-10-CM | POA: Insufficient documentation

## 2017-06-19 DIAGNOSIS — G8929 Other chronic pain: Secondary | ICD-10-CM | POA: Diagnosis not present

## 2017-06-19 DIAGNOSIS — Z0000 Encounter for general adult medical examination without abnormal findings: Secondary | ICD-10-CM | POA: Diagnosis not present

## 2017-06-19 DIAGNOSIS — Z114 Encounter for screening for human immunodeficiency virus [HIV]: Secondary | ICD-10-CM | POA: Diagnosis not present

## 2017-06-19 DIAGNOSIS — Z124 Encounter for screening for malignant neoplasm of cervix: Secondary | ICD-10-CM | POA: Diagnosis not present

## 2017-06-19 DIAGNOSIS — E038 Other specified hypothyroidism: Secondary | ICD-10-CM | POA: Diagnosis not present

## 2017-06-19 DIAGNOSIS — N870 Mild cervical dysplasia: Secondary | ICD-10-CM | POA: Insufficient documentation

## 2017-06-19 LAB — BASIC METABOLIC PANEL
BUN: 10 mg/dL (ref 6–23)
CO2: 27 mEq/L (ref 19–32)
Calcium: 9.4 mg/dL (ref 8.4–10.5)
Chloride: 101 mEq/L (ref 96–112)
Creatinine, Ser: 0.82 mg/dL (ref 0.40–1.20)
GFR: 102.93 mL/min (ref >60.00)
Glucose, Bld: 106 mg/dL — ABNORMAL HIGH (ref 70–99)
POTASSIUM: 4.1 meq/L (ref 3.5–5.1)
SODIUM: 136 meq/L (ref 135–145)

## 2017-06-19 LAB — LIPID PANEL
CHOLESTEROL: 240 mg/dL — AB (ref 0–200)
HDL: 31.1 mg/dL — AB (ref >39.00)
LDL Cholesterol: 181 mg/dL — ABNORMAL HIGH (ref 0–99)
NonHDL: 209.14
Total CHOL/HDL Ratio: 8
Triglycerides: 142 mg/dL (ref 0.0–149.0)
VLDL: 28.4 mg/dL (ref 0.0–40.0)

## 2017-06-19 LAB — T3, FREE: T3, Free: 3.5 pg/mL (ref 2.3–4.2)

## 2017-06-19 LAB — TSH: TSH: 1.93 u[IU]/mL (ref 0.35–4.50)

## 2017-06-19 LAB — T4, FREE: FREE T4: 0.74 ng/dL (ref 0.60–1.60)

## 2017-06-19 NOTE — Patient Instructions (Signed)
A few things to remember from today's visit:   No diagnosis found.   At least 150 minutes of moderate exercise per week, daily brisk walking for 15-30 min is a good exercise option. Healthy diet low in saturated (animal) fats and sweets and consisting of fresh fruits and vegetables, lean meats such as fish and white chicken and whole grains.   - Vaccines:  Tdap vaccine every 10 years.  Shingles vaccine recommended at age 34, could be given after 34 years of age but not sure about insurance coverage.  Pneumonia vaccines:  Prevnar 13 at 65 and Pneumovax at 32.  Screening recommendations for low/normal risk women:  Screening for diabetes at age 63-45 and every 3 years.  Cervical cancer prevention:  -HPV vaccination between 56-26 years old. -Pap smear starts at 34 years of age and continues periodically until 34 years old in low risk women. Pap smear every 3 years between 9 and 9 years old. Pap smear every 3 years between women 52 and older if pap smear negative and HPV screening negative.   -Breast cancer: Mammogram: There is disagreement between experts about when to start screening in low risk asymptomatic female but recent recommendations are to start screening at 59 and not later than 34 years old , every 1-2 years and after 34 yo q 2 years. Screening is recommended until 34 years old but some women can continue screening depending of healthy issues.   Colon cancer screening: starts at 34 years old until 34 years old.  Cholesterol disorder screening at age 22 and every 3 years.  Also recommended:  1. Dental visit- Brush and floss your teeth twice daily; visit your dentist twice a year. 2. Eye doctor- Get an eye exam at least every 2 years. 3. Helmet use- Always wear a helmet when riding a bicycle, motorcycle, rollerblading or skateboarding. 4. Safe sex- If you may be exposed to sexually transmitted infections, use a condom. 5. Seat belts- Seat belts can save your live;  always wear one. 6. Smoke/Carbon Monoxide detectors- These detectors need to be installed on the appropriate level of your home. Replace batteries at least once a year. 7. Skin cancer- When out in the sun please cover up and use sunscreen 15 SPF or higher. 8. Violence- If anyone is threatening or hurting you, please tell your healthcare provider.  9. Drink alcohol in moderation- Limit alcohol intake to one drink or less per day. Never drink and drive.   Please be sure medication list is accurate. If a new problem present, please set up appointment sooner than planned today.

## 2017-06-20 LAB — HIV ANTIBODY (ROUTINE TESTING W REFLEX): HIV: NONREACTIVE

## 2017-06-21 ENCOUNTER — Encounter: Payer: Self-pay | Admitting: Family Medicine

## 2017-06-21 LAB — CYTOLOGY - PAP
Chlamydia: NEGATIVE
HPV: NOT DETECTED
Neisseria Gonorrhea: NEGATIVE
Trichomonas: NEGATIVE

## 2017-06-23 ENCOUNTER — Encounter: Payer: Self-pay | Admitting: Family Medicine

## 2017-06-23 MED ORDER — THYROID 30 MG PO TABS: 30.0000 mg | ORAL_TABLET | Freq: Every day | ORAL | 3 refills | Status: DC

## 2017-06-24 ENCOUNTER — Telehealth: Payer: Self-pay | Admitting: Family Medicine

## 2017-06-24 DIAGNOSIS — R87619 Unspecified abnormal cytological findings in specimens from cervix uteri: Secondary | ICD-10-CM

## 2017-06-24 NOTE — Telephone Encounter (Signed)
Referral placed.

## 2017-06-24 NOTE — Telephone Encounter (Signed)
Pt states her obgyn was in [place].  Pt would like to establish with new OBGYN in [place]. They are requesting referral for her to be seen for the slightly abnormal pap. Can you send referral to  [facility] /   First available ok with pt.

## 2017-07-11 ENCOUNTER — Telehealth: Payer: Self-pay | Admitting: Family Medicine

## 2017-07-11 NOTE — Telephone Encounter (Signed)
disregard

## 2017-07-22 ENCOUNTER — Other Ambulatory Visit: Payer: Self-pay

## 2017-07-22 DIAGNOSIS — E038 Other specified hypothyroidism: Secondary | ICD-10-CM

## 2017-07-22 MED ORDER — THYROID 30 MG PO TABS: 30.0000 mg | ORAL_TABLET | Freq: Every day | ORAL | 3 refills | Status: DC

## 2017-07-29 ENCOUNTER — Other Ambulatory Visit: Payer: Self-pay | Admitting: Obstetrics and Gynecology

## 2017-07-29 DIAGNOSIS — D259 Leiomyoma of uterus, unspecified: Secondary | ICD-10-CM

## 2017-08-02 ENCOUNTER — Ambulatory Visit
Admission: RE | Admit: 2017-08-02 | Discharge: 2017-08-02 | Disposition: A | Discharge status: Home / Self Care | Payer: Commercial Managed Care - PPO | Source: Ambulatory Visit | Attending: Obstetrics and Gynecology | Admitting: Obstetrics and Gynecology

## 2017-08-02 DIAGNOSIS — D259 Leiomyoma of uterus, unspecified: Secondary | ICD-10-CM

## 2017-08-02 MED ORDER — IOPAMIDOL (ISOVUE-300) INJECTION 61%
100.0000 mL | Freq: Once | INTRAVENOUS | Status: AC | PRN
  Administered 2017-08-02: 100 mL via INTRAVENOUS

## 2017-09-12 ENCOUNTER — Encounter: Payer: Self-pay | Admitting: Family Medicine

## 2017-09-13 ENCOUNTER — Other Ambulatory Visit: Payer: Self-pay | Admitting: Family Medicine

## 2017-09-13 DIAGNOSIS — I10 Essential (primary) hypertension: Secondary | ICD-10-CM

## 2018-01-16 IMAGING — CT CT ABD-PELV W/ CM
1 of 2 series · 13 of 32 positions shown, 17 images · IV contrast (APPLIED)
Comparison: None.

CLINICAL DATA: Abdominal pain. Periumbilical bleeding with
menstruation

EXAM:
CT ABDOMEN AND PELVIS WITH CONTRAST
TECHNIQUE: Multidetector CT imaging of the abdomen and pelvis was performed
using the standard protocol following bolus administration of
intravenous contrast. Oral contrast was also administered.
CONTRAST:  100mL [id number] IOPAMIDOL (SP67Q5-IJJ) INJECTION 61%

[Series 2: abd/pelvis w/cm · axial · 0.87mm/px · z∈[-532,-37]mm · 13 of 114 slices shown, 17 images]
[im 10/114  soft-tissue]
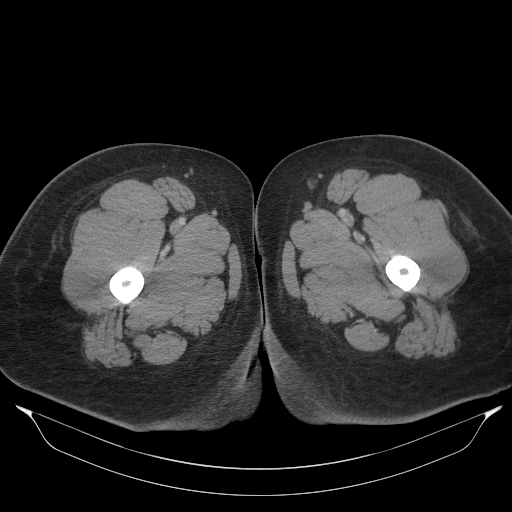
[im 10/114  bone]
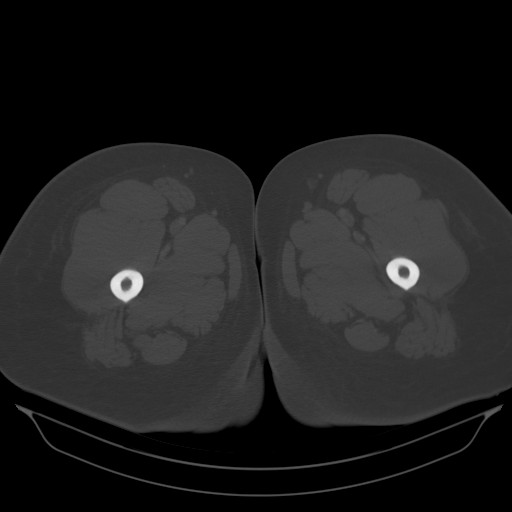
[im 19/114  soft-tissue]
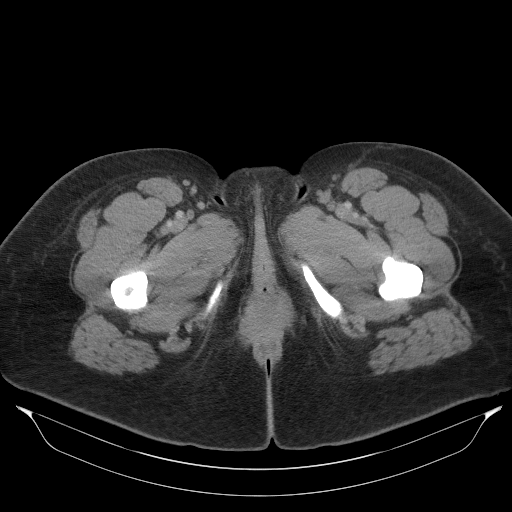
[im 28/114  soft-tissue]
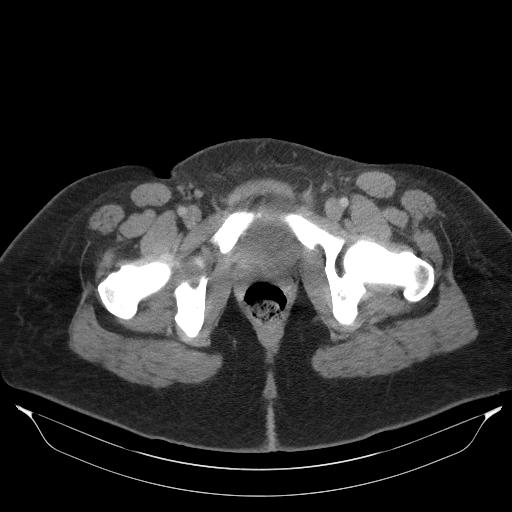
[im 37/114  soft-tissue]
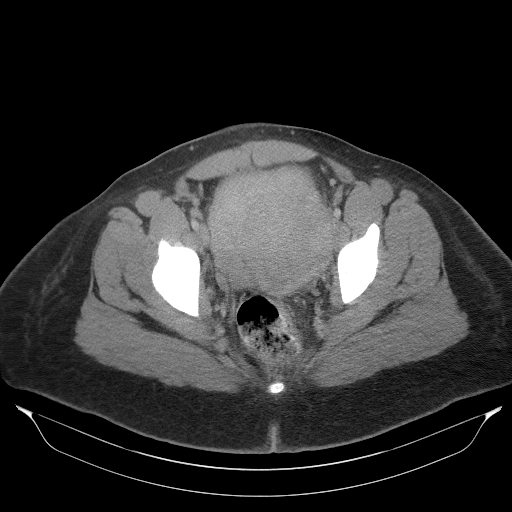
[im 46/114  soft-tissue]
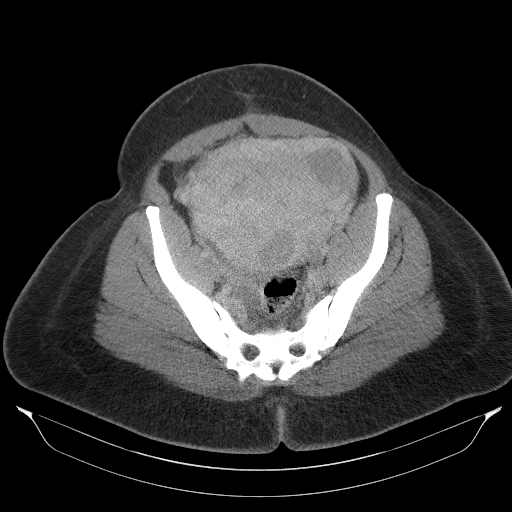
[im 59/114  soft-tissue]
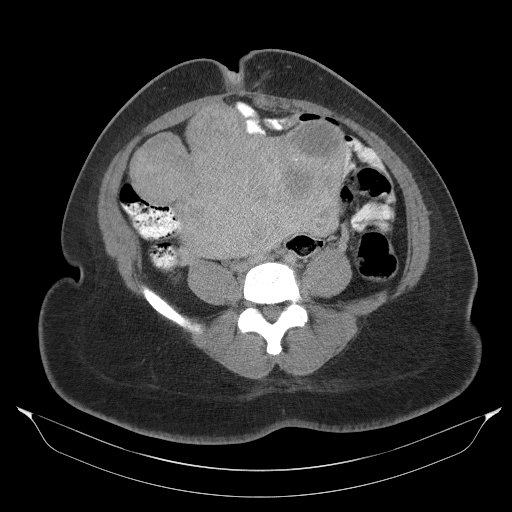
[im 68/114  soft-tissue]
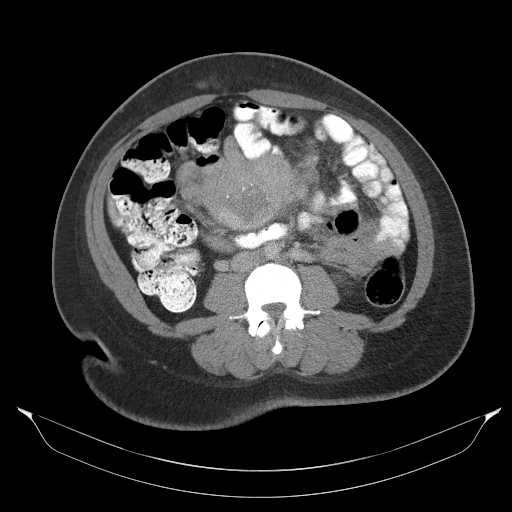
[im 77/114  soft-tissue]
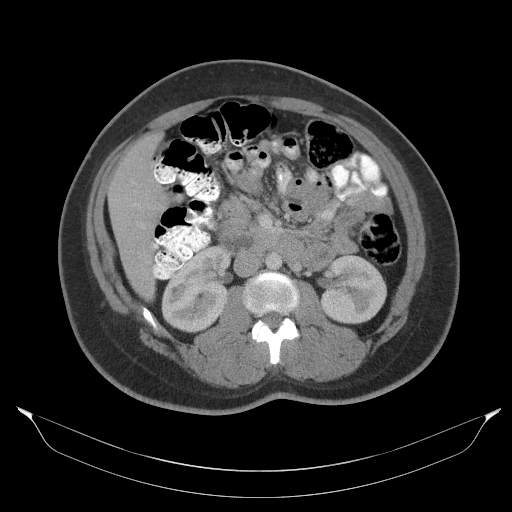
[im 86/114  soft-tissue]
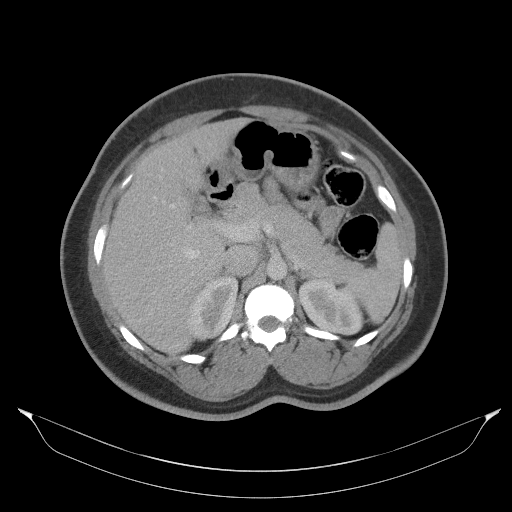
[im 86/114  bone]
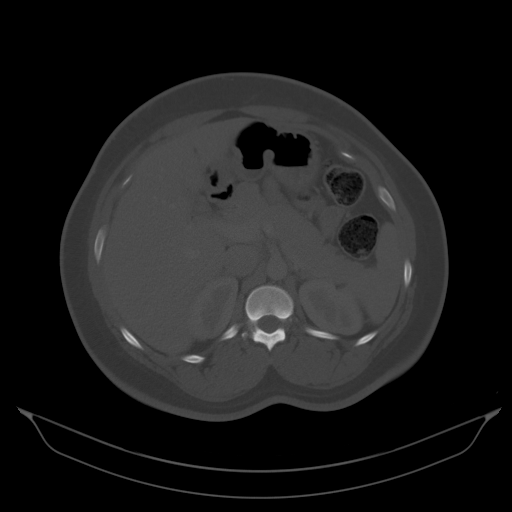
[im 95/114  soft-tissue]
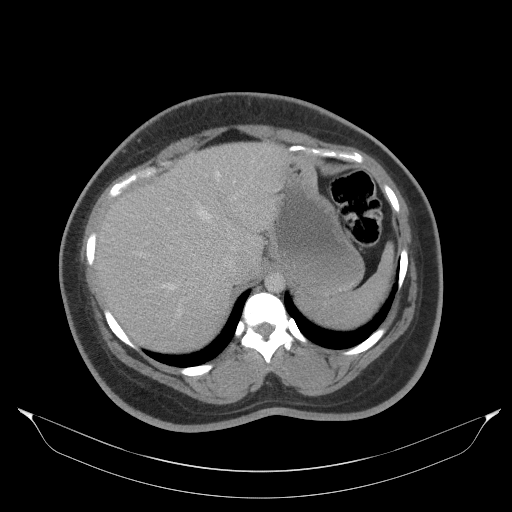
[im 95/114  lung]
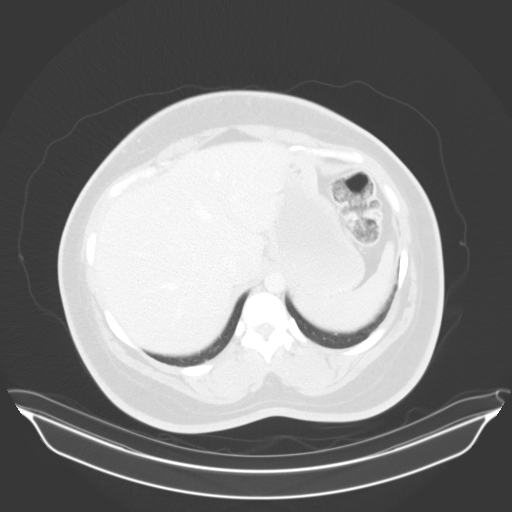
[im 100/114  lung]
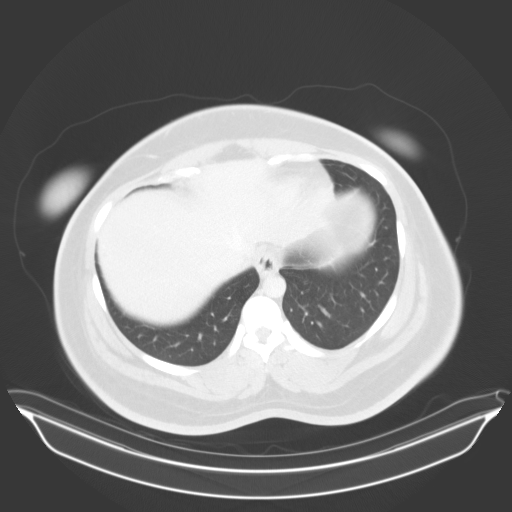
[im 104/114  soft-tissue]
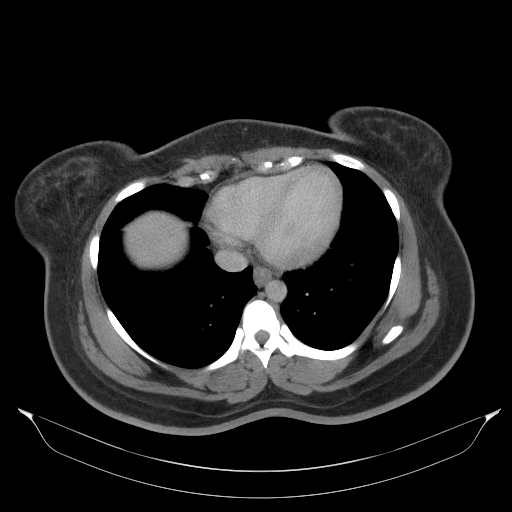
[im 104/114  lung]
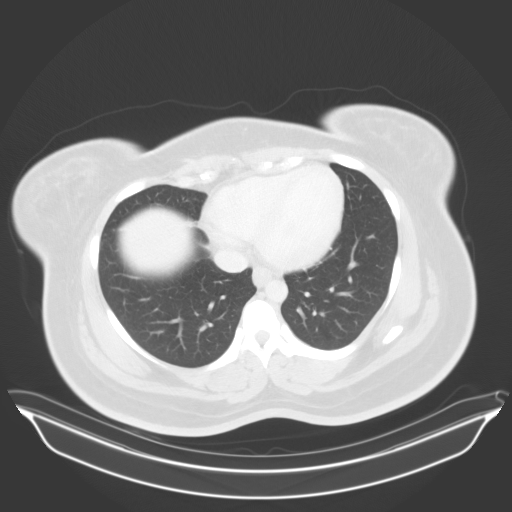
[im 109/114  lung]
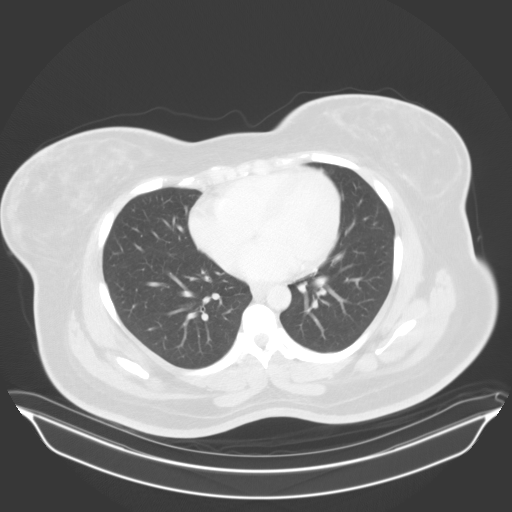

[13 of 32 positions shown; findings below may reference images not displayed]

FINDINGS: Lower chest: Lung bases are clear.  There is a small hiatal hernia.

Hepatobiliary: No focal liver lesions are apparent. Gallbladder wall
is not appreciably thickened. There is no biliary duct dilatation.

Pancreas: No pancreatic mass or inflammatory focus.

Spleen: No splenic lesions are evident.

Adrenals/Urinary Tract: Adrenals appear normal bilaterally. Kidneys
bilaterally show no evident mass or hydronephrosis on either side.
There is no renal or ureteral calculus on either side. Urinary
bladder is midline with wall thickness within normal limits. Urinary
bladder is impressed upon by the uterus.

Stomach/Bowel: There is no appreciable bowel wall or mesenteric
thickening. No evident bowel obstruction. No free air or portal
venous air.

Vascular/Lymphatic: There is no abdominal aortic aneurysm. No
vascular lesions are evident. There is no appreciable adenopathy in
the abdomen or pelvis.

Reproductive: The uterus is markedly enlarged, measuring 20.1 cm
from superior to inferior dimension, 13.8 cm from anterior to
posterior dimension, and 18.3 cm from right to left dimension. There
are multiple masses throughout the uterus measuring as large as 5 x
5 mm as well as areas of localized hypervascularity, particularly in
a mass in the superior aspect of the uterus. There is a probable
dominant follicle in the right ovary measuring approximately 3 x
cm. Fluid tracks from the right ovary into the right pelvis
suggesting recent ovarian cyst leakage or rupture. No other pelvic
mass evident.

Other: There is a small ventral hernia which contains soft tissue
opacity measuring approximately 3 x 1.5 cm. This soft tissue opacity
does not show evidence of surrounding inflammation or abnormal
fluid. No abscess in this region. There is a small vessel from the
upper abdominal wall supplying this lesion; it does not appear to
enhance. Its etiology is uncertain. This hernia is located
approximately 5 cm superior to the umbilicus.

There is scarring in the region of the umbilicus. There is no
demonstrable fistula between the uterus and the umbilicus.

There is no ascites or abscess in the abdomen or pelvis. Appendix
appears unremarkable.

Musculoskeletal: There are no blastic or lytic bone lesions. No
intramuscular lesions are evident.
IMPRESSION: 1. Markedly enlarged leiomyomatous uterus. Uterus impresses upon the
urinary bladder. Several individual leiomyomas within the uterus are
hypervascular with rather significant hypervascularity in a mass in
the superior uterus.

2. 5 cm superior to the umbilicus, there is a rather minimal ventral
hernia. There is a small ventral hernia which contains a 3 x 1.5 cm
of soft tissue opacity without surrounding inflammation. This area
does not show enhancement, although a diminutive vascular structures
supplies this area. Etiology of this area of opacity is uncertain.
The lack of enhancement in this area raises question of an area of
chronic fibrosis. Given clinical history, a small endometrial
implant/focus of endometriosis in this area is also a possibility.

3. Periumbilical scarring. There is no apparent fistula between the
uterus and the umbilicus. A well-defined mass is not seen within the
umbilicus. It should be noted that given the history, a small
implanted endometriosis in the umbilicus could account for the
reported hemorrhage in this area during menstruation.

4. No bowel wall thickening or bowel obstruction. No abscess.
Appendix appears normal.

5.  No renal or ureteral calculus.  No hydronephrosis.

6.  Small hiatal hernia.

7. Small focus of fluid tracking to the right of the right ovary.
Question recent ovarian cyst leakage. All

## 2018-04-15 ENCOUNTER — Ambulatory Visit: Payer: Commercial Managed Care - PPO

## 2018-04-29 ENCOUNTER — Encounter: Payer: Commercial Managed Care - PPO | Admitting: Family Medicine

## 2018-06-06 ENCOUNTER — Ambulatory Visit (INDEPENDENT_AMBULATORY_CARE_PROVIDER_SITE_OTHER): Payer: Managed Care, Other (non HMO) | Admitting: Family Medicine

## 2018-06-06 ENCOUNTER — Encounter: Payer: Self-pay | Admitting: Family Medicine

## 2018-06-06 VITALS — BP 124/80 | HR 72 | Temp 98.6°F | Resp 12 | Ht 68.0 in | Wt 241.2 lb

## 2018-06-06 DIAGNOSIS — Z0000 Encounter for general adult medical examination without abnormal findings: Secondary | ICD-10-CM

## 2018-06-06 DIAGNOSIS — Z131 Encounter for screening for diabetes mellitus: Secondary | ICD-10-CM

## 2018-06-06 DIAGNOSIS — I10 Essential (primary) hypertension: Secondary | ICD-10-CM

## 2018-06-06 DIAGNOSIS — E038 Other specified hypothyroidism: Secondary | ICD-10-CM

## 2018-06-06 DIAGNOSIS — E782 Mixed hyperlipidemia: Secondary | ICD-10-CM

## 2018-06-06 LAB — LIPID PANEL
CHOLESTEROL: 250 — AB (ref 0–200)
HDL: 37 (ref 35–70)
LDL CALC: 181

## 2018-06-06 NOTE — Assessment & Plan Note (Signed)
No changes in current management, will follow labs done today and will give further recommendations accordingly. Follow-up in 6 to 12 months. 

## 2018-06-06 NOTE — Progress Notes (Signed)
HPI:   [name] is a 37 y.o. female, who is here today for her routine CPE.  She was last seen in 05/2017. Since her last visit she has follow with nephrologist. She follows with gynecologist for her female preventive care. Last Pap smear in 05/2017 was abnormal, she reporting colpo Bx negative.  History of fibroids, currently she is on OCP.  According to patient, surgical options were discussed and she was told to call to let them know what she has decided about treatment.  She lives alone. She has not been sexually active for about a year.  History of hypertension and proteinuria. Currently she is on Micardis 40 mg daily and HCTZ 25 mg daily.   Lab Results  Component Value Date   CREATININE 0.82 06/19/2017   BUN 10 06/19/2017   NA 136 06/19/2017   K 4.1 06/19/2017   CL 101 06/19/2017   CO2 27 06/19/2017   Prediabetes: She is not exercising regularly. She is trying to eat healthy, she is using hello fresh and cooking more at home.  Lab Results  Component Value Date   HGBA1C 6.0 05/13/2015    She sees her nephrologist q 6 months.   Hypothyroidism: She is currently on Armour Thyroid 30 mg daily.  Lab Results  Component Value Date   TSH 1.93 06/19/2017     Review of Systems  Constitutional: Negative for appetite change, fatigue and fever.  HENT: Negative for hearing loss, mouth sores, trouble swallowing and voice change.   Eyes: Negative for redness and visual disturbance.  Respiratory: Negative for cough, shortness of breath and wheezing.   Cardiovascular: Negative for chest pain and leg swelling.  Gastrointestinal: Negative for abdominal pain, nausea and vomiting.       No changes in bowel habits.  Endocrine: Negative for cold intolerance, heat intolerance, polydipsia, polyphagia and polyuria.  Genitourinary: Negative for decreased urine volume, dysuria, hematuria, vaginal bleeding and vaginal discharge.  Musculoskeletal: Negative for  arthralgias, back pain and neck pain.  Skin: Negative for color change and rash.  Neurological: Negative for syncope, weakness, numbness and headaches.  Psychiatric/Behavioral: Negative for confusion and sleep disturbance. The patient is not nervous/anxious.   All other systems reviewed and are negative.   Current Outpatient Medications on File Prior to Visit  Medication Sig Dispense Refill  . hydrochlorothiazide (HYDRODIURIL) 25 MG tablet TAKE 1 TABLET DAILY 90 tablet 2  . norethindrone-ethinyl estradiol (CYCLAFEM,ALYACEN) 0.5/0.75/1-35 MG-MCG tablet Take 1 tablet by mouth daily.    Marland Kitchen telmisartan (MICARDIS) 40 MG tablet     . thyroid (ARMOUR THYROID) 30 MG tablet Take 1 tablet (30 mg total) by mouth daily. 90 tablet 3  . tretinoin (RETIN-A) 0.025 % cream Apply topically at bedtime. 45 g 1   No current facility-administered medications on file prior to visit.      Past Medical History:  Diagnosis Date  . Hyperlipidemia   . Hypertension   . Thyroid disease    hypothyroid   Allergies  Allergen Reactions  . Other     Social History   Socioeconomic History  . Marital status: Single    Spouse name: Not on file  . Number of children: Not on file  . Years of education: Not on file  . Highest education level: Not on file  Occupational History  . Not on file  Social Needs  . Financial resource strain: Not on file  . Food insecurity:    Worry: Not on file  Inability: Not on file  . Transportation needs:    Medical: Not on file    Non-medical: Not on file  Tobacco Use  . Smoking status: Never Smoker  . Smokeless tobacco: Never Used  Substance and Sexual Activity  . Alcohol use: No  . Drug use: No  . Sexual activity: Not Currently    Partners: Male    Birth control/protection: Abstinence  Lifestyle  . Physical activity:    Days per week: Not on file    Minutes per session: Not on file  . Stress: Not on file  Relationships  . Social connections:    Talks on  phone: Not on file    Gets together: Not on file    Attends religious service: Not on file    Active member of club or organization: Not on file    Attends meetings of clubs or organizations: Not on file    Relationship status: Not on file  Other Topics Concern  . Not on file  Social History Narrative  . Not on file    Vitals:   06/06/18 0841  BP: 124/80  Pulse: 72  Resp: 12  Temp: 98.6 F (37 C)  SpO2: 97%   Body mass index is 36.68 kg/m.   Wt Readings from Last 3 Encounters:  06/06/18 241 lb 4 oz (109.4 kg)  06/19/17 241 lb 8 oz (109.5 kg)  01/23/17 243 lb (110.2 kg)     Physical Exam  Nursing note and vitals reviewed. Constitutional: She is oriented to person, place, and time. She appears well-developed. No distress.  HENT:  Head: Atraumatic.  Right Ear: Hearing, tympanic membrane, external ear and ear canal normal.  Left Ear: Hearing, tympanic membrane, external ear and ear canal normal.  Mouth/Throat: Uvula is midline, oropharynx is clear and moist and mucous membranes are normal.  Eyes: Pupils are equal, round, and reactive to light. Conjunctivae and EOM are normal.  Neck: No thyroid mass and no thyromegaly present.  Cardiovascular: Normal rate and regular rhythm.  No murmur heard. Pulses:      Dorsalis pedis pulses are 2+ on the right side, and 2+ on the left side.  Respiratory: Effort normal and breath sounds normal. No respiratory distress.  GI: Soft. She exhibits mass (fibroid palpated above umbilicus.). There is no hepatomegaly. There is no tenderness.  Musculoskeletal: She exhibits no edema or tenderness.  No major deformity or sing of synovitis appreciated.  Lymphadenopathy:    She has no cervical adenopathy.       Right: No supraclavicular adenopathy present.       Left: No supraclavicular adenopathy present.  Neurological: She is alert and oriented to person, place, and time. She has normal strength. No cranial nerve deficit. Coordination and gait  normal.  Reflex Scores:      Bicep reflexes are 2+ on the right side and 2+ on the left side.      Patellar reflexes are 2+ on the right side and 2+ on the left side. Skin: Skin is warm. No rash noted. No erythema.  Psychiatric: She has a normal mood and affect. Her speech is normal.  Well groomed, good eye contact.       ASSESSMENT AND PLAN:   [name] was seen today for her routine CPE.  Orders Placed This Encounter  Procedures  . Hemoglobin A1c  . Lipid Panel  . TSH   Lab Results  Component Value Date   CHOL 250 (H) 06/06/2018   HDL  37 (L) 06/06/2018   LDLCALC 181 (H) 06/06/2018   TRIG 160 (H) 06/06/2018   CHOLHDL 6.8 (H) 06/06/2018    Lab Results  Component Value Date   HGBA1C 6.1 (H) 06/06/2018   Lab Results  Component Value Date   TSH 2.790 06/06/2018    Routine general medical examination at a health care facility  We discussed the importance of regular physical activity and healthy diet for prevention of chronic illness and/or complications. Preventive guidelines reviewed. Vaccination up to date.  Next CPE in a year.  Diabetes mellitus screening - Hemoglobin A1c  Hyperlipidemia, mixed Continue nonpharmacologic treatment. Further recommendations will be given according to lab results. Follow-up in 6 to 12 months depending of lipid results.  Hypothyroidism No changes in current management, will follow labs done today and will give further recommendations accordingly. Follow-up in 6 to 12 months.  Essential hypertension Adequately controlled. No changes in current management. Continue low-salt diet. Eye exam recommended annually. Since she is also following with nephrologist, I will see her back in a year.         Betty G. Martinique, MD  [facility]. [place] office.

## 2018-06-06 NOTE — Patient Instructions (Addendum)
A few things to remember from today's visit:   Routine general medical examination at a health care facility  Other specified hypothyroidism - Plan: TSH  Hyperlipidemia, mixed - Plan: Lipid panel  Diabetes mellitus screening - Plan: Hemoglobin A1c   Please be sure medication list is accurate. If a new problem present, please set up appointment sooner than planned today.  Today you have you routine preventive visit.  At least 150 minutes of moderate exercise per week, daily brisk walking for 15-30 min is a good exercise option. Healthy diet low in saturated (animal) fats and sweets and consisting of fresh fruits and vegetables, lean meats such as fish and white chicken and whole grains.  These are some of recommendations for screening depending of age and risk factors:   - Vaccines:  Tdap vaccine every 10 years.  Shingles vaccine recommended at age 42, could be given after 35 years of age but not sure about insurance coverage.   Pneumonia vaccines:  Prevnar 13 at 65 and Pneumovax at 65. Sometimes Pneumovax is giving earlier if history of smoking, lung disease,diabetes,kidney disease among some.    Screening for diabetes at age 14 and every 3 years.  Cervical cancer prevention:  Pap smear starts at 35 years of age and continues periodically until 35 years old in low risk women. Pap smear every 3 years between 64 and 21 years old. Pap smear every 3-5 years between women 41 and older if pap smear negative and HPV screening negative.  Please schedule appointment with your gyn.  -Breast cancer: Mammogram: There is disagreement between experts about when to start screening in low risk asymptomatic female but recent recommendations are to start screening at 51 and not later than 35 years old , every 1-2 years and after 35 yo q 2 years. Screening is recommended until 35 years old but some women can continue screening depending of healthy issues.   Colon cancer screening: starts at  35 years old until 35 years old.  Cholesterol disorder screening at age 26 and every 3 years.  Also recommended:  1. Dental visit- Brush and floss your teeth twice daily; visit your dentist twice a year. 2. Eye doctor- Get an eye exam at least every 2 years. 3. Helmet use- Always wear a helmet when riding a bicycle, motorcycle, rollerblading or skateboarding. 4. Safe sex- If you may be exposed to sexually transmitted infections, use a condom. 5. Seat belts- Seat belts can save your live; always wear one. 6. Smoke/Carbon Monoxide detectors- These detectors need to be installed on the appropriate level of your home. Replace batteries at least once a year. 7. Skin cancer- When out in the sun please cover up and use sunscreen 15 SPF or higher. 8. Violence- If anyone is threatening or hurting you, please tell your healthcare provider.  9. Drink alcohol in moderation- Limit alcohol intake to one drink or less per day. Never drink and drive.           Why follow it? Research shows. . Those who follow the Mediterranean diet have a reduced risk of heart disease  . The diet is associated with a reduced incidence of Parkinson's and Alzheimer's diseases . People following the diet may have longer life expectancies and lower rates of chronic diseases  . The Dietary Guidelines for Americans recommends the Mediterranean diet as an eating plan to promote health and prevent disease  What Is the Mediterranean Diet?  . Healthy eating plan based on typical foods and  recipes of Mediterranean-style cooking . The diet is primarily a plant based diet; these foods should make up a majority of meals   Starches - Plant based foods should make up a majority of meals - They are an important sources of vitamins, minerals, energy, antioxidants, and fiber - Choose whole grains, foods high in fiber and minimally processed items  - Typical grain sources include wheat, oats, barley, corn, brown rice, bulgar, farro,  millet, polenta, couscous  - Various types of beans include chickpeas, lentils, fava beans, black beans, white beans   Fruits  Veggies - Large quantities of antioxidant rich fruits & veggies; 6 or more servings  - Vegetables can be eaten raw or lightly drizzled with oil and cooked  - Vegetables common to the traditional Mediterranean Diet include: artichokes, arugula, beets, broccoli, brussel sprouts, cabbage, carrots, celery, collard greens, cucumbers, eggplant, kale, leeks, lemons, lettuce, mushrooms, okra, onions, peas, peppers, potatoes, pumpkin, radishes, rutabaga, shallots, spinach, sweet potatoes, turnips, zucchini - Fruits common to the Mediterranean Diet include: apples, apricots, avocados, cherries, clementines, dates, figs, grapefruits, grapes, melons, nectarines, oranges, peaches, pears, pomegranates, strawberries, tangerines  Fats - Replace butter and margarine with healthy oils, such as olive oil, canola oil, and tahini  - Limit nuts to no more than a handful a day  - Nuts include walnuts, almonds, pecans, pistachios, pine nuts  - Limit or avoid candied, honey roasted or heavily salted nuts - Olives are central to the [employer] - can be eaten whole or used in a variety of dishes   Meats Protein - Limiting red meat: no more than a few times a month - When eating red meat: choose lean cuts and keep the portion to the size of deck of cards - Eggs: approx. 0 to 4 times a week  - Fish and lean poultry: at least 2 a week  - Healthy protein sources include, chicken, [place], lean beef, lamb - Increase intake of seafood such as tuna, salmon, trout, mackerel, shrimp, scallops - Avoid or limit high fat processed meats such as sausage and bacon  Dairy - Include moderate amounts of low fat dairy products  - Focus on healthy dairy such as fat free yogurt, skim milk, low or reduced fat cheese - Limit dairy products higher in fat such as whole or 2% milk, cheese, ice cream  Alcohol -  Moderate amounts of red wine is ok  - No more than 5 oz daily for women (all ages) and men older than age 2  - No more than 10 oz of wine daily for men younger than 49  Other - Limit sweets and other desserts  - Use herbs and spices instead of salt to flavor foods  - Herbs and spices common to the traditional Mediterranean Diet include: basil, bay leaves, chives, cloves, cumin, fennel, garlic, lavender, marjoram, mint, oregano, parsley, pepper, rosemary, sage, savory, sumac, tarragon, thyme   It's not just a diet, it's a lifestyle:  . The Mediterranean diet includes lifestyle factors typical of those in the region  . Foods, drinks and meals are best eaten with others and savored . Daily physical activity is important for overall good health . This could be strenuous exercise like running and aerobics . This could also be more leisurely activities such as walking, housework, yard-work, or taking the stairs . Moderation is the key; a balanced and healthy diet accommodates most foods and drinks . Consider portion sizes and frequency of consumption of certain foods  Meal Ideas & Options:  . Breakfast:  o Whole wheat toast or whole wheat English muffins with peanut butter & hard boiled egg o Steel cut oats topped with apples & cinnamon and skim milk  o Fresh fruit: banana, strawberries, melon, berries, peaches  o Smoothies: strawberries, bananas, greek yogurt, peanut butter o Low fat greek yogurt with blueberries and granola  o Egg white omelet with spinach and mushrooms o Breakfast couscous: whole wheat couscous, apricots, skim milk, cranberries  . Sandwiches:  o Hummus and grilled vegetables (peppers, zucchini, squash) on whole wheat bread   o Grilled chicken on whole wheat pita with lettuce, tomatoes, cucumbers or tzatziki  o Tuna salad on whole wheat bread: tuna salad made with greek yogurt, olives, red peppers, capers, green onions o Garlic rosemary lamb pita: lamb sauted with garlic,  rosemary, salt & pepper; add lettuce, cucumber, greek yogurt to pita - flavor with lemon juice and black pepper  . Seafood:  o Mediterranean grilled salmon, seasoned with garlic, basil, parsley, lemon juice and black pepper o Shrimp, lemon, and spinach whole-grain pasta salad made with low fat greek yogurt  o Seared scallops with lemon orzo  o Seared tuna steaks seasoned salt, pepper, coriander topped with tomato mixture of olives, tomatoes, olive oil, minced garlic, parsley, green onions and cappers  . Meats:  o Herbed greek chicken salad with kalamata olives, cucumber, feta  o Red bell peppers stuffed with spinach, bulgur, lean ground beef (or lentils) & topped with feta   o Kebabs: skewers of chicken, tomatoes, onions, zucchini, squash  o [place] burgers: made with red onions, mint, dill, lemon juice, feta cheese topped with roasted red peppers . Vegetarian o Cucumber salad: cucumbers, artichoke hearts, celery, red onion, feta cheese, tossed in olive oil & lemon juice  o Hummus and whole grain pita points with a greek salad (lettuce, tomato, feta, olives, cucumbers, red onion) o Lentil soup with celery, carrots made with vegetable broth, garlic, salt and pepper  o Tabouli salad: parsley, bulgur, mint, scallions, cucumbers, tomato, radishes, lemon juice, olive oil, salt and pepper.

## 2018-06-06 NOTE — Assessment & Plan Note (Signed)
Adequately controlled. No changes in current management. Continue low-salt diet. Eye exam recommended annually. Since she is also following with nephrologist, I will see her back in a year.

## 2018-06-06 NOTE — Assessment & Plan Note (Signed)
Continue nonpharmacologic treatment. Further recommendations will be given according to lab results. Follow-up in 6 to 12 months depending of lipid results.

## 2018-06-07 ENCOUNTER — Encounter: Payer: Self-pay | Admitting: Family Medicine

## 2018-06-07 LAB — LIPID PANEL
Chol/HDL Ratio: 6.8 ratio — ABNORMAL HIGH (ref 0.0–4.4)
Cholesterol, Total: 250 mg/dL — ABNORMAL HIGH (ref 100–199)
HDL: 37 mg/dL — ABNORMAL LOW (ref >39)
LDL Calculated: 181 mg/dL — ABNORMAL HIGH (ref 0–99)
Triglycerides: 160 mg/dL — ABNORMAL HIGH (ref 0–149)
VLDL CHOLESTEROL CAL: 32 mg/dL (ref 5–40)

## 2018-06-07 LAB — HEMOGLOBIN A1C
ESTIMATED AVERAGE GLUCOSE: 128 mg/dL
Hgb A1c MFr Bld: 6.1 % — ABNORMAL HIGH (ref 4.8–5.6)

## 2018-06-07 LAB — TSH: TSH: 2.79 u[IU]/mL (ref 0.450–4.500)

## 2018-06-10 ENCOUNTER — Encounter: Payer: Self-pay | Admitting: Family Medicine

## 2018-06-10 ENCOUNTER — Other Ambulatory Visit: Payer: Self-pay | Admitting: *Deleted

## 2018-06-10 MED ORDER — ATORVASTATIN CALCIUM 20 MG PO TABS: 20.0000 mg | ORAL_TABLET | Freq: Every day | ORAL | 3 refills | Status: DC

## 2018-07-02 ENCOUNTER — Other Ambulatory Visit: Payer: Self-pay | Admitting: *Deleted

## 2018-07-02 ENCOUNTER — Telehealth: Payer: Self-pay | Admitting: Family Medicine

## 2018-07-02 DIAGNOSIS — E038 Other specified hypothyroidism: Secondary | ICD-10-CM

## 2018-07-02 MED ORDER — THYROID 30 MG PO TABS: 30.0000 mg | ORAL_TABLET | Freq: Every day | ORAL | 3 refills | Status: DC

## 2018-07-02 NOTE — Telephone Encounter (Signed)
Rx refilled per protocol -  LOV/lab  06/06/18

## 2018-07-02 NOTE — Telephone Encounter (Signed)
Copied from [place] [id number]. Topic: Quick Communication - See Telephone Encounter >> Jul 02, 2018  8:51 AM Mylinda Latina, NT wrote: CRM for notification. See Telephone encounter for: 07/02/18. Patient called and states she needs a refill of her thyroid [facility] THYROID) 30 MG tablet  [place], [place] [phone] (Phone) [phone] (Fax)

## 2018-07-04 ENCOUNTER — Telehealth: Payer: Self-pay | Admitting: *Deleted

## 2018-07-04 NOTE — Telephone Encounter (Signed)
Prior auth for Armour thyroid 30mg  sent to [email or web address] [id number].

## 2018-07-07 ENCOUNTER — Other Ambulatory Visit: Payer: Self-pay | Admitting: *Deleted

## 2018-07-07 MED ORDER — LEVOTHYROXINE SODIUM 25 MCG PO TABS: 25.0000 ug | ORAL_TABLET | Freq: Every day | ORAL | 3 refills | Status: DC

## 2018-07-07 NOTE — Telephone Encounter (Signed)
Levothyroxine 25 mcg sent to [facility] as requested and Armour Thyroid 30 mg d/c'd.

## 2018-07-07 NOTE — Telephone Encounter (Signed)
Pt states that [facility] called her and stated that the Armour Thyroid was denied because that on the PA when asked if this was ongoing therapy the person checked no but pt states it is. [facility] also told her she needs to take generic but she is use to taking the Armour Thyroid for the last 5 years and would like the doctors opinion.

## 2018-07-07 NOTE — Telephone Encounter (Signed)
Message sent to Dr. Jordan for review. 

## 2018-07-07 NOTE — Telephone Encounter (Signed)
The first line of treatment for hypothyroidism is usually levothyroxine. I continue with Armour Thyroid because she was already taking it when she established care. She could try levothyroxine and TSH check in 8 weeks.  Thanks, BJ

## 2018-07-07 NOTE — Telephone Encounter (Signed)
Message forwarded to Dr Doug Sou asst for providers recommendations.

## 2018-09-18 ENCOUNTER — Other Ambulatory Visit: Payer: Self-pay

## 2018-09-18 ENCOUNTER — Ambulatory Visit (HOSPITAL_COMMUNITY)
Admission: EM | Admit: 2018-09-18 | Discharge: 2018-09-18 | Disposition: A | Discharge status: Home / Self Care | Payer: Managed Care, Other (non HMO) | Attending: Family Medicine | Admitting: Family Medicine

## 2018-09-18 ENCOUNTER — Ambulatory Visit: Payer: Self-pay | Admitting: *Deleted

## 2018-09-18 ENCOUNTER — Ambulatory Visit (INDEPENDENT_AMBULATORY_CARE_PROVIDER_SITE_OTHER): Payer: Managed Care, Other (non HMO)

## 2018-09-18 ENCOUNTER — Encounter (HOSPITAL_COMMUNITY): Payer: Self-pay | Admitting: Emergency Medicine

## 2018-09-18 DIAGNOSIS — R42 Dizziness and giddiness: Secondary | ICD-10-CM | POA: Diagnosis present

## 2018-09-18 DIAGNOSIS — F419 Anxiety disorder, unspecified: Secondary | ICD-10-CM | POA: Diagnosis not present

## 2018-09-18 DIAGNOSIS — R0789 Other chest pain: Secondary | ICD-10-CM

## 2018-09-18 DIAGNOSIS — Z79899 Other long term (current) drug therapy: Secondary | ICD-10-CM | POA: Diagnosis not present

## 2018-09-18 DIAGNOSIS — I10 Essential (primary) hypertension: Secondary | ICD-10-CM | POA: Diagnosis not present

## 2018-09-18 DIAGNOSIS — E039 Hypothyroidism, unspecified: Secondary | ICD-10-CM | POA: Diagnosis not present

## 2018-09-18 DIAGNOSIS — E782 Mixed hyperlipidemia: Secondary | ICD-10-CM | POA: Diagnosis not present

## 2018-09-18 DIAGNOSIS — R079 Chest pain, unspecified: Secondary | ICD-10-CM | POA: Diagnosis present

## 2018-09-18 LAB — POCT I-STAT, CHEM 8
BUN: 12 mg/dL (ref 6–20)
CHLORIDE: 105 mmol/L (ref 98–111)
CREATININE: 0.8 mg/dL (ref 0.44–1.00)
Calcium, Ion: 1.18 mmol/L (ref 1.15–1.40)
GLUCOSE: 100 mg/dL — AB (ref 70–99)
HEMATOCRIT: 40 % (ref 36.0–46.0)
Hemoglobin: 13.6 g/dL (ref 12.0–15.0)
POTASSIUM: 3.7 mmol/L (ref 3.5–5.1)
Sodium: 140 mmol/L (ref 135–145)
TCO2: 23 mmol/L (ref 22–32)

## 2018-09-18 LAB — TSH: TSH: 2.295 u[IU]/mL (ref 0.350–4.500)

## 2018-09-18 MED ORDER — HYDROXYZINE HCL 25 MG PO TABS
25.0000 mg | ORAL_TABLET | Freq: Three times a day (TID) | ORAL | 0 refills | Status: AC | PRN
Start: 2018-09-18 — End: ?

## 2018-09-18 MED ORDER — OMEPRAZOLE 20 MG PO CPDR: 20.0000 mg | DELAYED_RELEASE_CAPSULE | Freq: Every day | ORAL | 0 refills | Status: AC

## 2018-09-18 MED ORDER — GI COCKTAIL ~~LOC~~
30.0000 mL | Freq: Once | ORAL | Status: AC
  Administered 2018-09-18: 30 mL via ORAL

## 2018-09-18 MED ORDER — GI COCKTAIL ~~LOC~~
ORAL | Status: AC
  Filled 2018-09-18: qty 30

## 2018-09-18 NOTE — ED Notes (Signed)
Being registered

## 2018-09-18 NOTE — ED Provider Notes (Signed)
[place]    CSN: [id number] Arrival date & time: 09/18/18  1735     History   Chief Complaint Chief Complaint  Patient presents with  . Dizziness  . Chest Pain    HPI Kaitlyn Cox is a 37 y.o. female.   [place] presents with complaints of chest tightness which started last night when she laid down flat at approximately 9-10pm. States today throughout the day she has felt intermittent sternal chest tightness as well as intermittent light headedness. Currently doesn't feel light headed, but does have some of the chest tightness. She states she feels that it has worsened if her stress level increases, work has been very stressful. States she has had anxiety in the past. Light headedness worsens if she has been sitting for a long time and then stands. No headache. No nausea, vomiting or diarrhea. No diaphoresis. No shortness of breath. Slight increase in pain with deep breathing. No cough. Denies any previous similar. No palpitations. Has not taken any medications for symptoms. She is on oral birth control. Travelled 10 hours in a car three weeks ago. No leg pain or swelling. Doesn't smoke. Hx of htn, thyroid disease, hyperlipidemia.     ROS per HPI.      Past Medical History:  Diagnosis Date  . Hyperlipidemia   . Hypertension   . Thyroid disease    hypothyroid    Patient Active Problem List   Diagnosis Date Noted  . Low back pain 06/19/2017  . Hyperlipidemia, mixed 06/11/2017  . BMI 36.0-36.9,adult 07/16/2016  . Acne vulgaris 12/10/2014  . Umbilical bleeding 40/98/1191  . Abdominal pain 05/20/2014  . Hypothyroidism 10/29/2013  . Essential hypertension 10/29/2013  . Hyperglycemia 10/29/2013    Past Surgical History:  Procedure Laterality Date  . MYOMECTOMY  2011  . UTERINE FIBROID SURGERY  5/14    OB History    Gravida  0   Para  0   Term      Preterm      AB      Living        SAB      TAB      Ectopic      Multiple      Live  Births               Home Medications    Prior to Admission medications   Medication Sig Start Date End Date Taking? Authorizing Provider  atorvastatin (LIPITOR) 20 MG tablet Take 1 tablet (20 mg total) by mouth daily. 06/10/18   Martinique, Betty G, MD  hydrochlorothiazide (HYDRODIURIL) 25 MG tablet TAKE 1 TABLET DAILY 09/16/17   Martinique, Betty G, MD  hydrOXYzine (ATARAX/VISTARIL) 25 MG tablet Take 1 tablet (25 mg total) by mouth every 8 (eight) hours as needed for anxiety. 09/18/18   Zigmund Gottron, NP  levothyroxine (SYNTHROID, LEVOTHROID) 25 MCG tablet Take 1 tablet (25 mcg total) by mouth daily before breakfast. 07/07/18   Martinique, Betty G, MD  norethindrone-ethinyl estradiol [facility]) 0.5/0.75/1-35 MG-MCG tablet Take 1 tablet by mouth daily.    [provider]  omeprazole (PRILOSEC) 20 MG capsule Take 1 capsule (20 mg total) by mouth daily. 09/18/18   Zigmund Gottron, NP  telmisartan (MICARDIS) 40 MG tablet  05/28/18   [provider]  tretinoin (RETIN-A) 0.025 % cream Apply topically at bedtime. 05/13/15   Kennyth Arnold, FNP    Family History Family History  Problem Relation Age of Onset  .  Diabetes Mother   . Anxiety disorder Mother   . Hypertension Mother   . Anxiety disorder Father   . Hypertension Father   . Diabetes Maternal Grandmother   . Breast cancer Maternal Grandmother   . Lung cancer Paternal Grandfather        lung    Social History Social History   Tobacco Use  . Smoking status: Never Smoker  . Smokeless tobacco: Never Used  Substance Use Topics  . Alcohol use: No  . Drug use: No     Allergies   Other   Review of Systems Review of Systems   Physical Exam Triage Vital Signs ED Triage Vitals  Enc Vitals Group     BP 09/18/18 1816 124/88     Pulse Rate 09/18/18 1816 70     Resp 09/18/18 1816 18     Temp 09/18/18 1816 98.7 F (37.1 C)     Temp Source 09/18/18 1816 Oral     SpO2 09/18/18 1816 98 %     Weight --       Height --      Head Circumference --      Peak Flow --      Pain Score 09/18/18 1814 6     Pain Loc --      Pain Edu? --      Excl. in [place]? --    Orthostatic VS for the past 24 hrs:  BP- Lying Pulse- Lying BP- Sitting Pulse- Sitting BP- Standing at 0 minutes Pulse- Standing at 0 minutes  09/18/18 1919 (!) 136/91 65 (!) 141/101 75 (!) 136/94 78    Updated Vital Signs BP 124/88 (BP Location: Right Arm) Comment (BP Location): large cuff  Pulse 70   Temp 98.7 F (37.1 C) (Oral)   Resp 18   SpO2 98%    Physical Exam  Constitutional: She is oriented to person, place, and time. She appears well-developed and well-nourished. No distress.  Cardiovascular: Normal rate, regular rhythm and normal heart sounds.  Pulmonary/Chest: Effort normal and breath sounds normal. She exhibits no mass, no tenderness and no laceration.  Abdominal: Soft. Bowel sounds are normal.  Neurological: She is alert and oriented to person, place, and time.  Skin: Skin is warm and dry.   ekg NSR rate of 70 without acute changes.   UC Treatments / Results  Labs (all labs ordered are listed, but only abnormal results are displayed) Labs Reviewed  POCT I-STAT, CHEM 8 - Abnormal; Notable for the following components:      Result Value   Glucose, Bld 100 (*)    All other components within normal limits  TSH    EKG None  Radiology Dg Chest 2 View  Result Date: 09/18/2018 CLINICAL DATA:  Centralized chest pain for 1 day with some shortness of breath. EXAM: CHEST - 2 VIEW COMPARISON:  None. FINDINGS: Borderline enlarged cardiac silhouette. Clear lungs with normal vascularity. Mild diffuse peribronchial thickening. Normal appearing bones. IMPRESSION: Mild bronchitic changes. Electronically Signed   By: Claudie Revering M.D.   On: 09/18/2018 19:11    Procedures Procedures (including critical care time)  Medications Ordered in UC Medications  gi cocktail (Maalox,Lidocaine,Donnatal) (30 mLs Oral Given 09/18/18 1914)     Initial Impression / Assessment and Plan / UC Course  I have reviewed the triage vital signs and the nursing notes.  Pertinent labs & imaging results that were available during my care of the patient were reviewed by me and considered in  my medical decision making (see chart for details).     No acute findings on exam. Non toxic in appearance. Afebrile. No tachycardia or tachypnea, no hypoxia. No increased work of breathing. No distress. ekg reassuring. Chem 8 without acute findings. She is on birth control. No other significant risk factors for PE at this time, discussed this with patient as well. TSH pending and will notify patient of any abnormal findings. GI cocktail provided in clinic, patient states that she does suffer from reflux at times. On recheck patient endorses feeling improved. Negative orthostatics. Initiated omeprazole as well as hydroxizine prn. Encouraged recheck with PCP next week. Return precautions provided. Patient verbalized understanding and agreeable to plan.  Ambulatory out of clinic without difficulty.    Final Clinical Impressions(s) / UC Diagnoses   Final diagnoses:  Atypical chest pain  Lightheadedness  Anxiety     Discharge Instructions     You chest xray and ekg are reassuring today. Your basic lab panel tonight is normal also. Your thyroid level is pending, I will notify you if this is abnormal or if you need follow up, or you may follow on your MyChart. We will try two things- medication to help with reflux, which can cause tightness sensation, especially when laying flat, as well as an as needed medication for anxiety. Hydroxyzine for anxiety may cause drowsiness.  Please follow up with your primary care provider for recheck in the next week.  If develop worsening of symptoms, chest pain, shortness of breath , worsening of dizziness, headache, nausea, sweating, or otherwise worsening please return or go to the Er.     ED Prescriptions     Medication Sig Dispense Auth. Provider   omeprazole (PRILOSEC) 20 MG capsule Take 1 capsule (20 mg total) by mouth daily. 30 capsule Augusto Gamble B, NP   hydrOXYzine (ATARAX/VISTARIL) 25 MG tablet Take 1 tablet (25 mg total) by mouth every 8 (eight) hours as needed for anxiety. 12 tablet Zigmund Gottron, NP     Controlled Substance Prescriptions [place] Controlled Substance Registry consulted? Not Applicable   Zigmund Gottron, NP 09/18/18 [phone]

## 2018-09-18 NOTE — Telephone Encounter (Signed)
Will monitor for ED arrival.  

## 2018-09-18 NOTE — ED Triage Notes (Signed)
Around 9 pm, last night had pain in center chest.  Patient reports this as a tightness, a squeezing.  Discomfort comes and goes.  Patient has had episodes of dizziness today.  Patient was able to sleep last night.  Denies nausea, denies vomiting, no sob.

## 2018-09-18 NOTE — Telephone Encounter (Signed)
Kaitlyn Cox is experiencing tightness at her upper center chest since last night around 9pm. Nothing seems to make it worse, it has been constant with the tightness since starting last night. Today she has had some mild lightheadedness when she goes to stand from sitting. Denies vertigo/fever/SOB/radiating pain. She is at work at this time. Rates the tightness a #4 but mild. Reports several co morbidities including htn, high cholesterol and insulin resistant. She did travel 10 hours by car earlier in September but has not experienced any symptoms until yesterday. Stated she does have a history of anxiety issues and does feel stressed with her job at this time. She has not taken any medications for these symptoms.  Per protocol and no PCP availability today, referred her to Urgent Care/ER.    Reason for Disposition . [1] Chest pain lasts > 5 minutes AND [2] age > 1 AND [3] at least one cardiac risk factor (i.e., hypertension, diabetes, obesity, smoker or strong family history of heart disease)  Answer Assessment - Initial Assessment Questions 1. LOCATION: "Where does it hurt?"       Top center area of chest above cleavage  2. RADIATION: "Does the pain go anywhere else?" (e.g., into neck, jaw, arms, back)     No does not radiate. 3. ONSET: "When did the chest pain begin?" (Minutes, hours or days)      Last night around 9:00pm 4. PATTERN "Does the pain come and go, or has it been constant since it started?"  "Does it get worse with exertion?"      constant 5. DURATION: "How long does it last" (e.g., seconds, minutes, hours)     constant 6. SEVERITY: "How bad is the pain?"  (e.g., Scale 1-10; mild, moderate, or severe)    - MILD (1-3): doesn't interfere with normal activities     - MODERATE (4-7): interferes with normal activities or awakens from sleep    - SEVERE (8-10): excruciating pain, unable to do any normal activities       Mild 4-5 she rates the tightness 7. C RISK FACTORS: "Do you have any  history of heart problems or risk factors for heart disease?" (e.g., prior heart attack, angina; high blood pressure, diabetes, being overweight, high cholesterol, smoking, or strong family history of heart disease)     Htn, high cholesterol, insulin resistant 8. PULMONARY RISK FACTORS: "Do you have any history of lung disease?"  (e.g., blood clots in lung, asthma, emphysema, birth control pills)     Birth control pills 9. CAUSE: "What do you think is causing the chest pain?"     Possibly anxiety from work related issues. 10. OTHER SYMPTOMS: "Do you have any other symptoms?" (e.g., dizziness, nausea, vomiting, sweating, fever, difficulty breathing, cough)       Lightheaded at times for a couple of days. 11. PREGNANCY: "Is there any chance you are pregnant?" "When was your last menstrual period?"       no  Protocols used: CHEST PAIN-A-AH

## 2018-09-18 NOTE — Discharge Instructions (Signed)
You chest xray and ekg are reassuring today. Your basic lab panel tonight is normal also. Your thyroid level is pending, I will notify you if this is abnormal or if you need follow up, or you may follow on your MyChart. We will try two things- medication to help with reflux, which can cause tightness sensation, especially when laying flat, as well as an as needed medication for anxiety. Hydroxyzine for anxiety may cause drowsiness.  Please follow up with your primary care provider for recheck in the next week.  If develop worsening of symptoms, chest pain, shortness of breath , worsening of dizziness, headache, nausea, sweating, or otherwise worsening please return or go to the Er.

## 2018-09-19 NOTE — Telephone Encounter (Signed)
Patient went to [facility] urgent care last night.

## 2018-09-25 ENCOUNTER — Encounter: Payer: Self-pay | Admitting: Family Medicine

## 2018-09-26 ENCOUNTER — Encounter: Payer: Self-pay | Admitting: Family Medicine

## 2018-09-26 ENCOUNTER — Ambulatory Visit (INDEPENDENT_AMBULATORY_CARE_PROVIDER_SITE_OTHER): Payer: Managed Care, Other (non HMO) | Admitting: Family Medicine

## 2018-09-26 VITALS — BP 118/70 | HR 75 | Temp 99.1°F | Resp 12 | Ht 68.0 in | Wt 245.2 lb

## 2018-09-26 DIAGNOSIS — E782 Mixed hyperlipidemia: Secondary | ICD-10-CM

## 2018-09-26 DIAGNOSIS — Z6837 Body mass index (BMI) 37.0-37.9, adult: Secondary | ICD-10-CM | POA: Insufficient documentation

## 2018-09-26 DIAGNOSIS — F321 Major depressive disorder, single episode, moderate: Secondary | ICD-10-CM | POA: Insufficient documentation

## 2018-09-26 DIAGNOSIS — E669 Obesity, unspecified: Secondary | ICD-10-CM | POA: Insufficient documentation

## 2018-09-26 DIAGNOSIS — I10 Essential (primary) hypertension: Secondary | ICD-10-CM | POA: Diagnosis not present

## 2018-09-26 DIAGNOSIS — R7303 Prediabetes: Secondary | ICD-10-CM | POA: Insufficient documentation

## 2018-09-26 DIAGNOSIS — F419 Anxiety disorder, unspecified: Secondary | ICD-10-CM

## 2018-09-26 MED ORDER — FLUOXETINE HCL 20 MG PO TABS: 20.0000 mg | ORAL_TABLET | Freq: Every day | ORAL | 1 refills | Status: DC

## 2018-09-26 NOTE — Assessment & Plan Note (Addendum)
A1c went from 6.1 to 6.3. Primary prevention through a healthy lifestyle. We discussed pharmacologic treatment options like metformin, we decided to hold on this for now. We will recheck A1c in 4 to 6 months.

## 2018-09-26 NOTE — Assessment & Plan Note (Signed)
We discussed pharmacologic treatment options. After discussion of some side effects, she agrees with trying fluoxetine 20 mg daily. Recommend starting fluoxetine 0.5 tablet and increase it to the whole tablet if well-tolerated in 5 to 7 days. Instructed about warning signs. Follow-up in 2 months.

## 2018-09-26 NOTE — Assessment & Plan Note (Signed)
Today I will have lipid panel results done at work she is reporting improvement of numbers. No changes in Lipitor 20 mg daily. Follow-up in 6 to 12 months.

## 2018-09-26 NOTE — Assessment & Plan Note (Signed)
Fluoxetine 20 mg and started today. She will continue hydroxyzine 25 mg 3 times daily as needed. Counseling also recommended. Follow-up in 2 months, before if needed.

## 2018-09-26 NOTE — Progress Notes (Signed)
HPI:   [name] is a 37 y.o. female, who is here today to reviewed biometric labs/evaluation at work. She forgot to bring form and lab results.  She was last seen on 06/06/18 for her CPE.  BMI:  She is eating healthier,still doing [employer], now eating more vegetarian meals. She is not exercising regularly, she stared going to the [facility]. She has been there 2 times in a month. She also has fibroid tumor, growing. She is omn continues OCP. She follows with gyn.   She remembers HgA1C elevated at 6.3. Lab Results  Component Value Date   HGBA1C 6.1 (H) 06/06/2018  Denies abdominal pain, nausea,vomiting, polydipsia,polyuria, or polyphagia.  Hyperlipidemia:  Currently on Lipitor 20 mg daily. Following a low fat diet: Yes.  She has not noted side effects with medication.  Lab Results  Component Value Date   CHOL 250 (H) 06/06/2018   HDL 37 (L) 06/06/2018   LDLCALC 181 (H) 06/06/2018   TRIG 160 (H) 06/06/2018   CHOLHDL 6.8 (H) 06/06/2018    Hypertension: Currently she is on HCTZ 25 mg daily and telmisartan 40 mg daily. She is following with her nephrologist periodically. Denies severe/frequent headache, visual changes, chest pain, dyspnea, palpitation, claudication, focal weakness, or edema.  Today she is c/o worsening anxiety. She started a new job in 03/2018 and has been under some stress. "A few years" Hx of anxiety.  No motivation and having crying spells.. Denies suicidal thoughts. Difficulty falling asleep.   She was in the ER recently c/o chest pain. She had negative cardiac work-up, discharged with Rx for Valium. She has taken Valium 5 mg as prescribed,2 times daily, she has felt much better. She denies side effects.   Review of Systems  Constitutional: Negative for activity change, appetite change, fatigue and fever.  HENT: Negative for mouth sores, nosebleeds and trouble swallowing.   Eyes: Negative for redness and visual disturbance.    Respiratory: Negative for cough, shortness of breath and wheezing.   Cardiovascular: Negative for chest pain, palpitations and leg swelling.  Gastrointestinal: Negative for abdominal pain, nausea and vomiting.       Negative for changes in bowel habits.  Endocrine: Negative for polydipsia, polyphagia and polyuria.  Genitourinary: Negative for decreased urine volume and hematuria.  Musculoskeletal: Negative for gait problem and myalgias.  Neurological: Negative for syncope, weakness and headaches.  Psychiatric/Behavioral: Positive for sleep disturbance. Negative for confusion and suicidal ideas. The patient is nervous/anxious.       Current Outpatient Medications on File Prior to Visit  Medication Sig Dispense Refill  . atorvastatin (LIPITOR) 20 MG tablet Take 1 tablet (20 mg total) by mouth daily. 90 tablet 3  . hydrochlorothiazide (HYDRODIURIL) 25 MG tablet TAKE 1 TABLET DAILY 90 tablet 2  . hydrOXYzine (ATARAX/VISTARIL) 25 MG tablet Take 1 tablet (25 mg total) by mouth every 8 (eight) hours as needed for anxiety. 12 tablet 0  . levothyroxine (SYNTHROID, LEVOTHROID) 25 MCG tablet Take 1 tablet (25 mcg total) by mouth daily before breakfast. 90 tablet 3  . norethindrone-ethinyl estradiol (CYCLAFEM,ALYACEN) 0.5/0.75/1-35 MG-MCG tablet Take 1 tablet by mouth daily.    Marland Kitchen omeprazole (PRILOSEC) 20 MG capsule Take 1 capsule (20 mg total) by mouth daily. 30 capsule 0  . telmisartan (MICARDIS) 40 MG tablet     . tretinoin (RETIN-A) 0.025 % cream Apply topically at bedtime. 45 g 1   No current facility-administered medications on file prior to visit.  Past Medical History:  Diagnosis Date  . BMI 36.0-36.9,adult 07/16/2016  . Hyperlipidemia   . Hypertension   . Thyroid disease    hypothyroid   Allergies  Allergen Reactions  . Other     Social History   Socioeconomic History  . Marital status: Single    Spouse name: Not on file  . Number of children: Not on file  . Years of  education: Not on file  . Highest education level: Not on file  Occupational History  . Not on file  Social Needs  . Financial resource strain: Not on file  . Food insecurity:    Worry: Not on file    Inability: Not on file  . Transportation needs:    Medical: Not on file    Non-medical: Not on file  Tobacco Use  . Smoking status: Never Smoker  . Smokeless tobacco: Never Used  Substance and Sexual Activity  . Alcohol use: No  . Drug use: No  . Sexual activity: Not Currently    Partners: Male    Birth control/protection: Abstinence  Lifestyle  . Physical activity:    Days per week: Not on file    Minutes per session: Not on file  . Stress: Not on file  Relationships  . Social connections:    Talks on phone: Not on file    Gets together: Not on file    Attends religious service: Not on file    Active member of club or organization: Not on file    Attends meetings of clubs or organizations: Not on file    Relationship status: Not on file  Other Topics Concern  . Not on file  Social History Narrative  . Not on file    Vitals:   09/26/18 0706  BP: 118/70  Pulse: 75  Resp: 12  Temp: 99.1 F (37.3 C)  SpO2: 98%   Body mass index is 37.29 kg/m.  Wt Readings from Last 3 Encounters:  09/26/18 245 lb 4 oz (111.2 kg)  06/06/18 241 lb 4 oz (109.4 kg)  06/19/17 241 lb 8 oz (109.5 kg)     Physical Exam  Nursing note and vitals reviewed. Constitutional: She is oriented to person, place, and time. She appears well-developed. No distress.  HENT:  Head: Normocephalic and atraumatic.  Mouth/Throat: Oropharynx is clear and moist and mucous membranes are normal.  Eyes: Pupils are equal, round, and reactive to light. Conjunctivae are normal.  Cardiovascular: Normal rate and regular rhythm.  No murmur heard. Pulses:      Dorsalis pedis pulses are 2+ on the right side, and 2+ on the left side.  Respiratory: Effort normal and breath sounds normal. No respiratory distress.    GI: Soft. She exhibits mass (fibroid palpable.). There is no hepatomegaly. There is no tenderness.    Musculoskeletal: She exhibits edema (LE trace pitting edema,bilateral). She exhibits no tenderness.  Lymphadenopathy:    She has no cervical adenopathy.  Neurological: She is alert and oriented to person, place, and time. She has normal strength. No cranial nerve deficit. Gait normal.  Skin: Skin is warm. No rash noted. No erythema.  Psychiatric: She has a normal mood and affect.  Well groomed, good eye contact.    ASSESSMENT AND PLAN:   [name] was seen today for follow-up.  No orders of the defined types were placed in this encounter.   Hyperlipidemia, mixed Today I will have lipid panel results done at work she is reporting improvement  of numbers. No changes in Lipitor 20 mg daily. Follow-up in 6 to 12 months.  Prediabetes A1c went from 6.1 to 6.3. Primary prevention through a healthy lifestyle. We discussed pharmacologic treatment options like metformin, we decided to hold on this for now. We will recheck A1c in 4 to 6 months.   Essential hypertension Adequately controlled. No changes in current management. Low-salt diet recommended.   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 We discussed benefits of wt loss as well as adverse effects of obesity. Consistency with healthy diet and physical activity recommended. 150 minutes of moderate regular physical activity per week recommended.    Depression, major, single episode, moderate ([place]) We discussed pharmacologic treatment options. After discussion of some side effects, she agrees with trying fluoxetine 20 mg daily. Recommend starting fluoxetine 0.5 tablet and increase it to the whole tablet if well-tolerated in 5 to 7 days. Instructed about warning signs. Follow-up in 2 months.  Anxiety disorder Fluoxetine 20 mg and started  today. She will continue hydroxyzine 25 mg 3 times daily as needed. Counseling also recommended. Follow-up in 2 months, before if needed.    She will have form fax to [place] to be completed.      Betty G. Martinique, MD  [facility]. [place] office.

## 2018-09-26 NOTE — Assessment & Plan Note (Signed)
Fibroid tumor may be contributing to weight. We discussed benefits of wt loss as well as adverse effects of obesity. Consistency with healthy diet and physical activity recommended. 150 minutes of moderate regular physical activity per week recommended.

## 2018-09-26 NOTE — Assessment & Plan Note (Signed)
Adequately controlled. No changes in current management. Low-salt diet recommended.

## 2018-09-26 NOTE — Patient Instructions (Addendum)
A few things to remember from today's visit:   Essential hypertension  Class 2 severe obesity due to excess calories with serious comorbidity and body mass index (BMI) of 37.0 to 37.9 in adult [facility])  Prediabetes  Hyperlipidemia, mixed  Anxiety disorder, unspecified type - Plan: FLUoxetine (PROZAC) 20 MG tablet  Fluoxetine started today,start with 1/2 tab for 5 days then increase to whole tab.  Please be sure medication list is accurate. If a new problem present, please set up appointment sooner than planned today.

## 2018-09-27 NOTE — Telephone Encounter (Signed)
The patient picked up form  No form fee

## 2018-11-03 ENCOUNTER — Other Ambulatory Visit: Payer: Self-pay | Admitting: *Deleted

## 2018-11-03 MED ORDER — ATORVASTATIN CALCIUM 20 MG PO TABS: 20.0000 mg | ORAL_TABLET | Freq: Every day | ORAL | 3 refills | Status: DC

## 2019-03-04 IMAGING — DX DG CHEST 2V
2 series · 2 of 2 positions shown · non-contrast
Comparison: None.

CLINICAL DATA: Centralized chest pain for 1 day with some shortness
of breath.

EXAM:
CHEST - 2 VIEW

[chest pa]
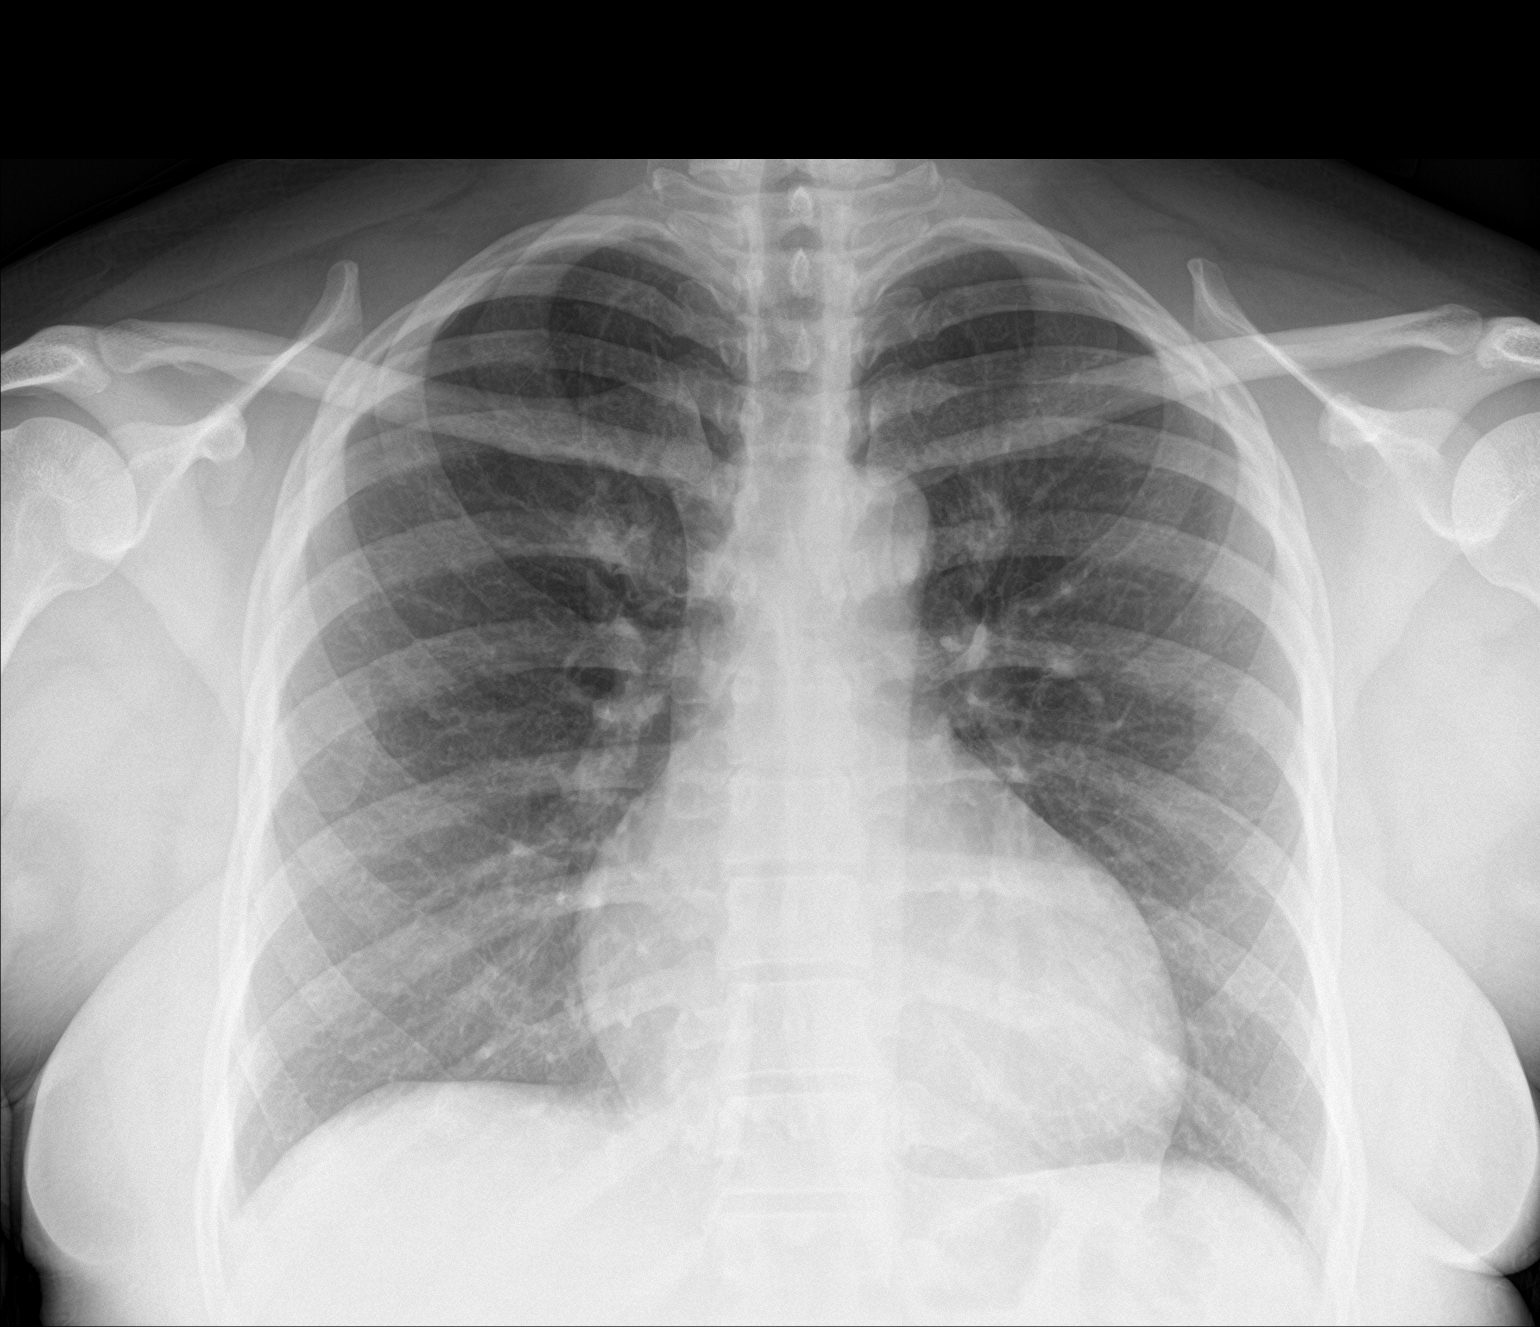

[chest lat]
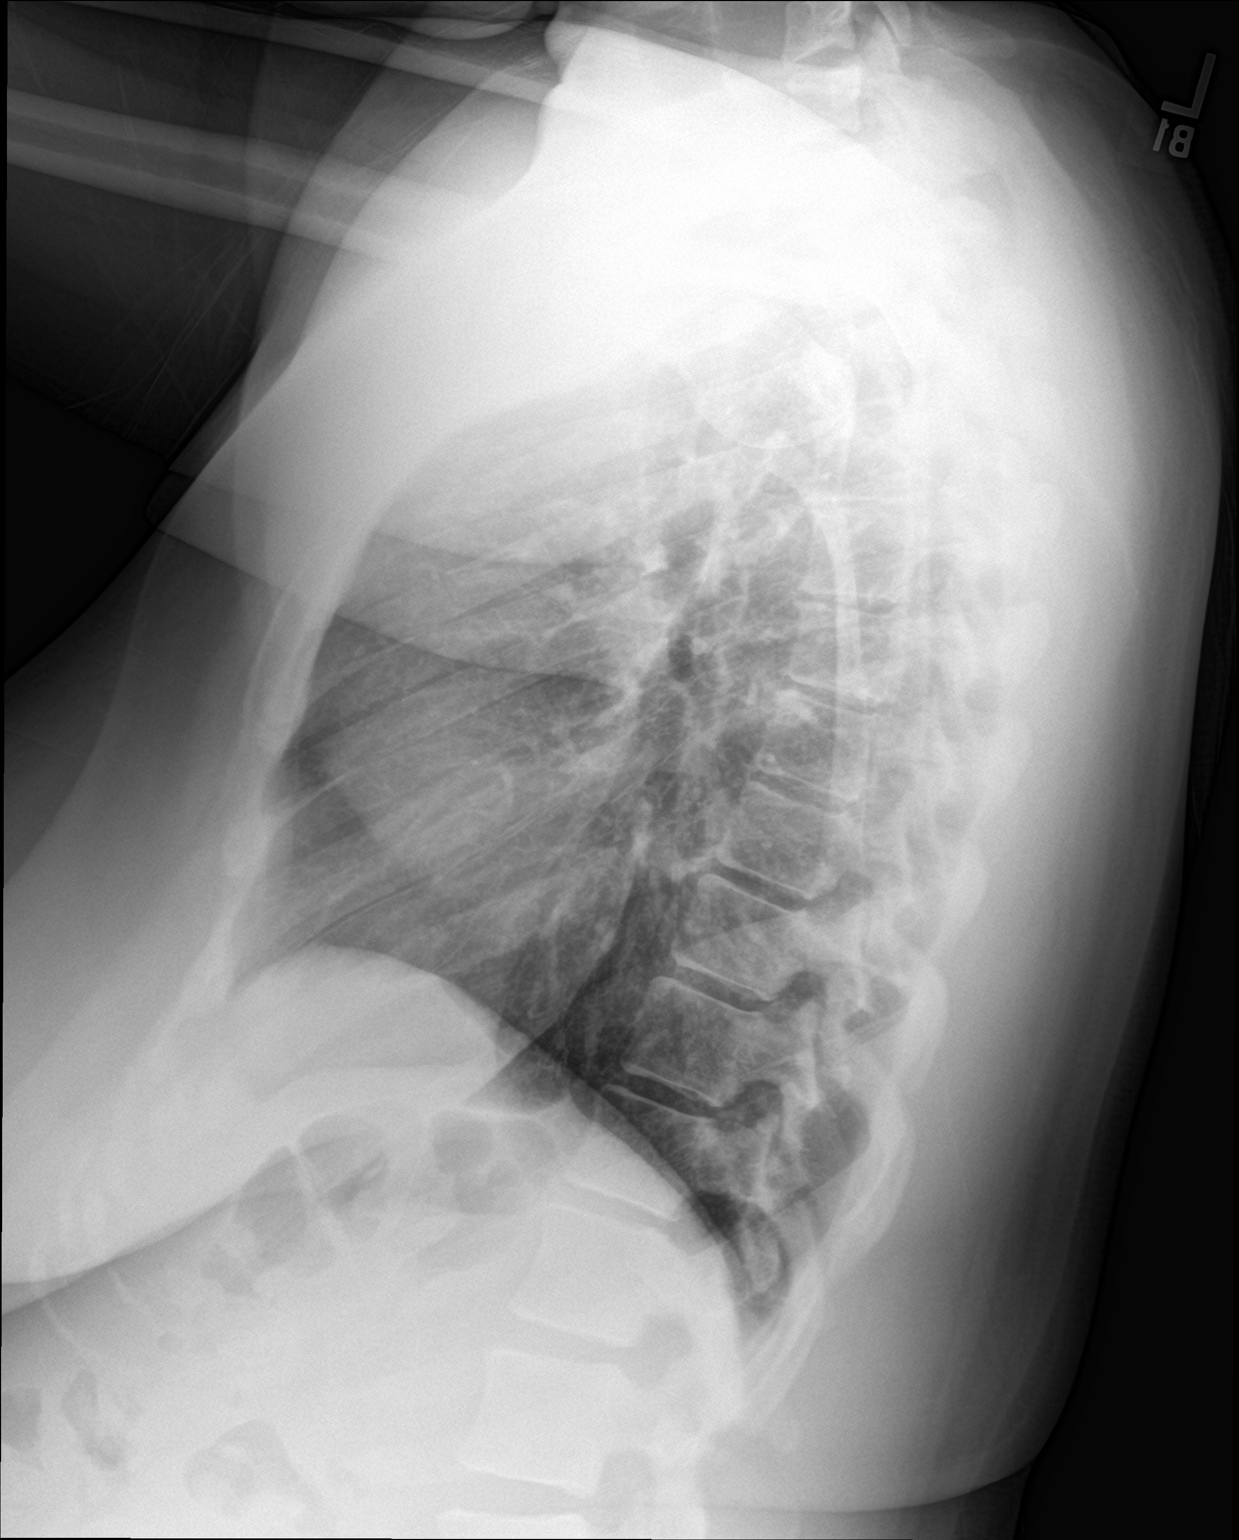

[2 of 2 positions shown; findings below may reference images not displayed]

FINDINGS: Borderline enlarged cardiac silhouette. Clear lungs with normal
vascularity. Mild diffuse peribronchial thickening. Normal appearing
bones.
IMPRESSION: Mild bronchitic changes.

## 2019-05-08 ENCOUNTER — Other Ambulatory Visit: Payer: Self-pay | Admitting: Family Medicine

## 2019-05-29 ENCOUNTER — Other Ambulatory Visit: Payer: Self-pay | Admitting: Obstetrics and Gynecology

## 2019-05-29 DIAGNOSIS — D259 Leiomyoma of uterus, unspecified: Secondary | ICD-10-CM

## 2019-06-10 ENCOUNTER — Other Ambulatory Visit: Payer: Self-pay | Admitting: Obstetrics and Gynecology

## 2019-06-11 ENCOUNTER — Other Ambulatory Visit: Payer: Self-pay | Admitting: Obstetrics and Gynecology

## 2019-06-12 ENCOUNTER — Other Ambulatory Visit: Payer: Self-pay | Admitting: Family Medicine

## 2019-06-13 ENCOUNTER — Other Ambulatory Visit: Payer: Managed Care, Other (non HMO)

## 2019-06-27 ENCOUNTER — Other Ambulatory Visit: Payer: Self-pay | Admitting: Family Medicine

## 2019-06-29 MED ORDER — ATORVASTATIN CALCIUM 20 MG PO TABS: 20.0000 mg | ORAL_TABLET | Freq: Every day | ORAL | 0 refills | Status: DC

## 2019-07-27 ENCOUNTER — Telehealth: Payer: Self-pay | Admitting: Family Medicine

## 2019-07-27 MED ORDER — ATORVASTATIN CALCIUM 20 MG PO TABS: 20.0000 mg | ORAL_TABLET | Freq: Every day | ORAL | 0 refills | Status: DC

## 2019-07-27 NOTE — Telephone Encounter (Signed)
Patient need to schedule an ov for more refills. Left detailed message for pt to schedule appt.

## 2019-07-27 NOTE — Telephone Encounter (Signed)
Medication Refill - Medication: atorvastatin (LIPITOR) 20 MG tablet    Has the patient contacted their pharmacy? No. Pt states she is out of this medication. Pt has an appt on 08/03/2019. Please advise.  (Agent: If no, request that the patient contact the pharmacy for the refill.) (Agent: If yes, when and what did the pharmacy advise?)  Preferred Pharmacy (with phone number or street name):  [facility] [place] [place] - [address]  [place] [address]  Phone: [phone] Fax: [phone]  Not a 24 hour pharmacy; exact hours not known.     Agent: Please be advised that RX refills may take up to 3 business days. We ask that you follow-up with your pharmacy.

## 2019-08-03 ENCOUNTER — Encounter: Payer: Managed Care, Other (non HMO) | Admitting: Family Medicine

## 2019-08-11 ENCOUNTER — Ambulatory Visit (INDEPENDENT_AMBULATORY_CARE_PROVIDER_SITE_OTHER): Payer: Managed Care, Other (non HMO) | Admitting: Family Medicine

## 2019-08-11 ENCOUNTER — Encounter: Payer: Self-pay | Admitting: Family Medicine

## 2019-08-11 VITALS — BP 122/78 | HR 80 | Temp 98.6°F | Resp 12 | Ht 68.5 in | Wt 254.6 lb

## 2019-08-11 DIAGNOSIS — E782 Mixed hyperlipidemia: Secondary | ICD-10-CM

## 2019-08-11 DIAGNOSIS — Z6838 Body mass index (BMI) 38.0-38.9, adult: Secondary | ICD-10-CM

## 2019-08-11 DIAGNOSIS — Z0000 Encounter for general adult medical examination without abnormal findings: Secondary | ICD-10-CM

## 2019-08-11 DIAGNOSIS — I10 Essential (primary) hypertension: Secondary | ICD-10-CM

## 2019-08-11 DIAGNOSIS — R7303 Prediabetes: Secondary | ICD-10-CM | POA: Diagnosis not present

## 2019-08-11 DIAGNOSIS — Z131 Encounter for screening for diabetes mellitus: Secondary | ICD-10-CM

## 2019-08-11 NOTE — Progress Notes (Signed)
HPI:   Kaitlyn Cox is a 38 y.o. female, who is here today for her routine physical.  Last CPE: 06/06/18.  Regular exercise 3 or more time per week: She was exercising regularly until 02/2019 when COVID 19 started. She is waking "sometimes" Following a healthy diet: Cooking more at home and trying to wash portions. She lives with her mother here in [place].  Chronic medical problems: Hypothyroidism,depresion,HLD,HTN,prediabetes,fibroids,and proteinuria.  Pap smear is current.She follows with gyn periodically. Hx of abnormal pap smears: Yes. She is considering surgical treatment for fibroid treatment.  Immunization History  Administered Date(s) Administered  . Influenza-Unspecified 08/25/2015  . Tdap 05/12/2012    She has no concerns today.  Follow up:  HLD,she is on Atorvastatin 20 mg daily. Tolerating medication well.  Lab Results  Component Value Date   CHOL 250 (H) 06/06/2018   HDL 37 (L) 06/06/2018   LDLCALC 181 (H) 06/06/2018   TRIG 160 (H) 06/06/2018   CHOLHDL 6.8 (H) 06/06/2018   Prediabetes: Denies abdominal pain, nausea,vomiting, polydipsia,polyuria, or polyphagia.  Hypothyroidism: She is on Levothyroxine 50 mcg daily. She has not noted abnormal wt loss,palpitations,cold/hear intolerance, changes in bowel movements,or tremor.  Lab Results  Component Value Date   TSH 2.295 09/18/2018   06/06/2018 HgA1C was 6.1.  Review of Systems  Constitutional: Negative for appetite change, fatigue and fever.  HENT: Negative for dental problem, hearing loss, mouth sores, sore throat, trouble swallowing and voice change.   Eyes: Negative for redness and visual disturbance.  Respiratory: Negative for cough, shortness of breath and wheezing.   Cardiovascular: Negative for chest pain and leg swelling.  Gastrointestinal: Negative for abdominal pain, nausea and vomiting.       No changes in bowel habits.  Endocrine: Negative for cold intolerance, heat  intolerance, polydipsia, polyphagia and polyuria.  Genitourinary: Negative for decreased urine volume, dysuria, hematuria, vaginal bleeding and vaginal discharge.  Musculoskeletal: Negative for gait problem and myalgias.  Skin: Negative for color change and rash.  Allergic/Immunologic: Positive for environmental allergies.  Neurological: Negative for syncope, weakness and headaches.  Hematological: Negative for adenopathy. Does not bruise/bleed easily.  Psychiatric/Behavioral: Negative for confusion. The patient is not nervous/anxious.   All other systems reviewed and are negative.   Current Outpatient Medications on File Prior to Visit  Medication Sig Dispense Refill  . atorvastatin (LIPITOR) 20 MG tablet Take 1 tablet (20 mg total) by mouth daily. 30 tablet 0  . hydrochlorothiazide (HYDRODIURIL) 25 MG tablet TAKE 1 TABLET DAILY 90 tablet 2  . hydrOXYzine (ATARAX/VISTARIL) 25 MG tablet Take 1 tablet (25 mg total) by mouth every 8 (eight) hours as needed for anxiety. 12 tablet 0  . levothyroxine (SYNTHROID) 50 MCG tablet TK 1 T PO QAM    . MICROGESTIN FE 1/20 1-20 MG-MCG tablet     . omeprazole (PRILOSEC) 20 MG capsule Take 1 capsule (20 mg total) by mouth daily. 30 capsule 0  . telmisartan (MICARDIS) 40 MG tablet     . tretinoin (RETIN-A) 0.025 % cream Apply topically at bedtime. 45 g 1   No current facility-administered medications on file prior to visit.     Past Medical History:  Diagnosis Date  . BMI 36.0-36.9,adult 07/16/2016  . Hyperlipidemia   . Hypertension   . Thyroid disease    hypothyroid    Past Surgical History:  Procedure Laterality Date  . MYOMECTOMY  2011  . UTERINE FIBROID SURGERY  5/14    Allergies  Allergen Reactions  .  Other     Family History  Problem Relation Age of Onset  . Diabetes Mother   . Anxiety disorder Mother   . Hypertension Mother   . Anxiety disorder Father   . Hypertension Father   . Diabetes Maternal Grandmother   . Breast  cancer Maternal Grandmother   . Lung cancer Paternal Grandfather        lung    Social History   Socioeconomic History  . Marital status: Single    Spouse name: Not on file  . Number of children: Not on file  . Years of education: Not on file  . Highest education level: Not on file  Occupational History  . Not on file  Social Needs  . Financial resource strain: Not on file  . Food insecurity    Worry: Not on file    Inability: Not on file  . Transportation needs    Medical: Not on file    Non-medical: Not on file  Tobacco Use  . Smoking status: Never Smoker  . Smokeless tobacco: Never Used  Substance and Sexual Activity  . Alcohol use: No  . Drug use: No  . Sexual activity: Not Currently    Partners: Male    Birth control/protection: Abstinence  Lifestyle  . Physical activity    Days per week: Not on file    Minutes per session: Not on file  . Stress: Not on file  Relationships  . Social Herbalist on phone: Not on file    Gets together: Not on file    Attends religious service: Not on file    Active member of club or organization: Not on file    Attends meetings of clubs or organizations: Not on file    Relationship status: Not on file  Other Topics Concern  . Not on file  Social History Narrative  . Not on file     Vitals:   08/11/19 1044  BP: 122/78  Pulse: 80  Resp: 12  Temp: 98.6 F (37 C)  SpO2: 97%   Body mass index is 38.15 kg/m.   Wt Readings from Last 3 Encounters:  08/11/19 254 lb 9.6 oz (115.5 kg)  09/26/18 245 lb 4 oz (111.2 kg)  06/06/18 241 lb 4 oz (109.4 kg)   Physical Exam  Nursing note and vitals reviewed. Constitutional: She is oriented to person, place, and time. She appears well-developed. No distress.  HENT:  Head: Normocephalic and atraumatic.  Right Ear: Hearing, tympanic membrane, external ear and ear canal normal.  Left Ear: Hearing, tympanic membrane, external ear and ear canal normal.  Mouth/Throat:  Uvula is midline, oropharynx is clear and moist and mucous membranes are normal.  Eyes: Pupils are equal, round, and reactive to light. Conjunctivae and EOM are normal.  Neck: No tracheal deviation present. No thyromegaly present.  Cardiovascular: Normal rate and regular rhythm.  No murmur heard. Pulses:      Dorsalis pedis pulses are 2+ on the right side and 2+ on the left side.  Respiratory: Effort normal and breath sounds normal. No respiratory distress.  GI: Soft. There is no hepatomegaly. There is no abdominal tenderness.  Palpable fibroid above of umbilicus.  Genitourinary:    Genitourinary Comments: Deferred to gyn.   Musculoskeletal:        General: No edema.     Comments: No major deformity or signs of synovitis appreciated.  Lymphadenopathy:    She has no cervical adenopathy.  Right: No supraclavicular adenopathy present.       Left: No supraclavicular adenopathy present.  Neurological: She is alert and oriented to person, place, and time. She has normal strength. No cranial nerve deficit. Coordination and gait normal.  Reflex Scores:      Bicep reflexes are 2+ on the right side and 2+ on the left side.      Patellar reflexes are 2+ on the right side and 2+ on the left side. Skin: Skin is warm. No rash noted. No erythema.  Psychiatric: She has a normal mood and affect. Cognition and memory are normal.  Well groomed, good eye contact.    ASSESSMENT AND PLAN:  Ms. Elveta Rape was here today annual physical examination.  Orders Placed This Encounter  Procedures  . Comprehensive metabolic panel  . Hemoglobin A1c  . Lipid panel    Lab Results  Component Value Date   HGBA1C 6.4 (H) 08/11/2019   Lab Results  Component Value Date   CHOL 183 08/11/2019   HDL 36 (L) 08/11/2019   LDLCALC 121 (H) 08/11/2019   TRIG 129 08/11/2019   CHOLHDL 5.1 (H) 08/11/2019   Lab Results  Component Value Date   ALT 15 08/11/2019   AST 14 08/11/2019   ALKPHOS 52 08/11/2019    BILITOT 0.4 08/11/2019   Lab Results  Component Value Date   CREATININE 0.88 08/11/2019   BUN 9 08/11/2019   NA 137 08/11/2019   K 3.9 08/11/2019   CL 101 08/11/2019   CO2 20 08/11/2019    Routine general medical examination at a health care facility We discussed the importance of regular physical activity and healthy diet for prevention of chronic illness and/or complications. Preventive guidelines reviewed. Vaccination up to date.  Next CPE in a year.  Essential hypertension BP well controlled. Continue HCTZ 25 mg daily and Telmisartan 40 mg daily. Recommend monitoring BP at home. Continue low salt diet.  Hyperlipidemia, mixed No changes in current management, will follow labs done today and will give further recommendations accordingly.  Prediabetes Healthy life style for primary prevention. Further recommendations will be given according to lab results.  Diabetes mellitus screening -     Comprehensive metabolic panel -     Hemoglobin A1c  Class 2 severe obesity due to excess calories with serious comorbidity and body mass index (BMI) of 38.0 to 38.9 in adult [facility]) We discussed benefits of wt loss as well as adverse effects of obesity. Consistency with healthy diet and physical activity recommended. Fibroid can also be contributing to her weight.  She will continue following with nephrologist annually for proteinuria.   Return in 1 year (on 08/10/2020).          Betty G. Martinique, MD  [facility]. [place] office.

## 2019-08-11 NOTE — Patient Instructions (Addendum)
Health Maintenance Due  Topic Date Due  . INFLUENZA VACCINE  07/25/2019    Depression screen [facility]: Lambert Rhonda W 2/9 09/26/2018 06/07/2018 07/25/2015  Decreased Interest 1 0 0  Down, Depressed, Hopeless 1 0 0  PHQ - 2 Score 2 0 0  Altered sleeping 2 - -  Tired, decreased energy 3 - -  Change in appetite 2 - -  Feeling bad or failure about yourself  1 - -  Trouble concentrating 1 - -  Moving slowly or fidgety/restless 1 - -  Suicidal thoughts 0 - -  PHQ-9 Score 12 - -  Today you have you routine preventive visit.  At least 150 minutes of moderate exercise per week, daily brisk walking for 15-30 min is a good exercise option. Healthy diet low in saturated (animal) fats and sweets and consisting of fresh fruits and vegetables, lean meats such as fish and white chicken and whole grains.  These are some of recommendations for screening depending of age and risk factors:   - Vaccines:  Tdap vaccine every 10 years.  Shingles vaccine recommended at age 27, could be given after 36 years of age but not sure about insurance coverage.   Pneumonia vaccines:  Prevnar 13 at 65 and Pneumovax at 85. Sometimes Pneumovax is giving earlier if history of smoking, lung disease,diabetes,kidney disease among some.    Screening for diabetes at age 44 and every 3 years.  Cervical cancer prevention:  Pap smear starts at 36 years of age and continues periodically until 36 years old in low risk women. Pap smear every 3 years between 12 and 48 years old. Pap smear every 3-5 years between women 76 and older if pap smear negative and HPV screening negative.   -Breast cancer: Mammogram: There is disagreement between experts about when to start screening in low risk asymptomatic female but recent recommendations are to start screening at 25 and not later than 36 years old , every 1-2 years and after 36 yo q 2 years. Screening is recommended until 36 years old but some women can continue screening depending of healthy  issues.   Colon cancer screening: starts at 36 years old until 36 years old.  Cholesterol disorder screening at age 53 and every 3 years.  Also recommended:  1. Dental visit- Brush and floss your teeth twice daily; visit your dentist twice a year. 2. Eye doctor- Get an eye exam at least every 2 years. 3. Helmet use- Always wear a helmet when riding a bicycle, motorcycle, rollerblading or skateboarding. 4. Safe sex- If you may be exposed to sexually transmitted infections, use a condom. 5. Seat belts- Seat belts can save your live; always wear one. 6. Smoke/Carbon Monoxide detectors- These detectors need to be installed on the appropriate level of your home. Replace batteries at least once a year. 7. Skin cancer- When out in the sun please cover up and use sunscreen 15 SPF or higher. 8. Violence- If anyone is threatening or hurting you, please tell your healthcare provider.  9. Drink alcohol in moderation- Limit alcohol intake to one drink or less per day. Never drink and drive.   No changes on your cholesterol medication or blood pressure medication. If everything is stable we can continue following annually.

## 2019-08-12 LAB — LIPID PANEL
Chol/HDL Ratio: 5.1 ratio — ABNORMAL HIGH (ref 0.0–4.4)
Cholesterol, Total: 183 mg/dL (ref 100–199)
HDL: 36 mg/dL — ABNORMAL LOW (ref >39)
LDL Calculated: 121 mg/dL — ABNORMAL HIGH (ref 0–99)
Triglycerides: 129 mg/dL (ref 0–149)
VLDL Cholesterol Cal: 26 mg/dL (ref 5–40)

## 2019-08-12 LAB — COMPREHENSIVE METABOLIC PANEL
ALT: 15 IU/L (ref 0–32)
AST: 14 IU/L (ref 0–40)
Albumin/Globulin Ratio: 1.5 (ref 1.2–2.2)
Albumin: 4.4 g/dL (ref 3.8–4.8)
Alkaline Phosphatase: 52 IU/L (ref 39–117)
BUN/Creatinine Ratio: 10 (ref 9–23)
BUN: 9 mg/dL (ref 6–20)
Bilirubin Total: 0.4 mg/dL (ref 0.0–1.2)
CO2: 20 mmol/L (ref 20–29)
Calcium: 9.2 mg/dL (ref 8.7–10.2)
Chloride: 101 mmol/L (ref 96–106)
Creatinine, Ser: 0.88 mg/dL (ref 0.57–1.00)
GFR calc Af Amer: 98 mL/min/{1.73_m2} (ref >59)
GFR calc non Af Amer: 85 mL/min/{1.73_m2} (ref >59)
Globulin, Total: 3 g/dL (ref 1.5–4.5)
Glucose: 105 mg/dL — ABNORMAL HIGH (ref 65–99)
Potassium: 3.9 mmol/L (ref 3.5–5.2)
Sodium: 137 mmol/L (ref 134–144)
Total Protein: 7.4 g/dL (ref 6.0–8.5)

## 2019-08-12 LAB — HEMOGLOBIN A1C
Est. average glucose Bld gHb Est-mCnc: 137 mg/dL
Hgb A1c MFr Bld: 6.4 % — ABNORMAL HIGH (ref 4.8–5.6)

## 2019-09-06 ENCOUNTER — Other Ambulatory Visit: Payer: Self-pay | Admitting: Family Medicine

## 2019-12-20 ENCOUNTER — Other Ambulatory Visit: Payer: Self-pay | Admitting: Family Medicine

## 2020-06-30 ENCOUNTER — Other Ambulatory Visit: Payer: Self-pay | Admitting: Family Medicine

## 2020-07-01 MED ORDER — LEVOTHYROXINE SODIUM 50 MCG PO TABS: ORAL_TABLET | ORAL | 1 refills | Status: DC

## 2020-10-27 ENCOUNTER — Other Ambulatory Visit: Payer: Self-pay | Admitting: Family Medicine

## 2020-11-03 ENCOUNTER — Other Ambulatory Visit: Payer: Self-pay | Admitting: Family Medicine

## 2021-01-20 ENCOUNTER — Other Ambulatory Visit: Payer: Self-pay | Admitting: Family Medicine

## 2021-02-24 ENCOUNTER — Telehealth: Payer: Self-pay | Admitting: Family Medicine

## 2021-02-24 NOTE — Telephone Encounter (Signed)
Noted, address updated.

## 2021-02-24 NOTE — Telephone Encounter (Signed)
FYI:  Pt is calling in to let us know that she is no longer affiliated with our office due to her having moved due to her job and she does have new insurance.  Pt wanted to let us know what her new address is [address] [place], [place] [address].
